# Patient Record
Sex: Male | Born: 1975 | Race: White | Hispanic: No | Marital: Married
Health system: Midwestern US, Community
[De-identification: ages and names within clinical notes are randomized; demographics above are authoritative.]

---

## 2016-10-29 ENCOUNTER — Inpatient Hospital Stay
Admit: 2016-10-29 | Discharge: 2016-10-29 | Disposition: A | Discharge status: Home / Self Care | Payer: PRIVATE HEALTH INSURANCE | Attending: Emergency Medicine

## 2016-10-29 DIAGNOSIS — L03011 Cellulitis of right finger: Secondary | ICD-10-CM

## 2016-10-29 MED ORDER — CEPHALEXIN 250 MG PO CAPS
250 MG | Freq: Once | ORAL | Status: AC
Start: 2016-10-29 — End: 2016-10-29
  Administered 2016-10-29: 14:00:00 500 mg via ORAL

## 2016-10-29 MED ORDER — BACITRACIN 500 UNIT/GM EX OINT
500 UNIT/GM | Freq: Once | CUTANEOUS | Status: AC
Start: 2016-10-29 — End: 2016-10-29
  Administered 2016-10-29: 14:00:00 via TOPICAL

## 2016-10-29 MED ORDER — CEPHALEXIN 500 MG PO CAPS
500 MG | ORAL_CAPSULE | Freq: Four times a day (QID) | ORAL | 0 refills | Status: AC
Start: 2016-10-29 — End: 2016-11-05

## 2016-10-29 MED ORDER — SULFAMETHOXAZOLE-TRIMETHOPRIM 800-160 MG PO TABS
800-160 MG | ORAL_TABLET | Freq: Two times a day (BID) | ORAL | 0 refills | Status: AC
Start: 2016-10-29 — End: 2016-11-05

## 2016-10-29 MED ORDER — SULFAMETHOXAZOLE-TRIMETHOPRIM 800-160 MG PO TABS
800-160 MG | Freq: Once | ORAL | Status: AC
Start: 2016-10-29 — End: 2016-10-29
  Administered 2016-10-29: 14:00:00 1 via ORAL

## 2016-10-29 MED FILL — BACITRACIN 500 UNIT/GM EX OINT: 500 UNIT/GM | CUTANEOUS | Qty: 28

## 2016-10-29 MED FILL — CEPHALEXIN 250 MG PO CAPS: 250 MG | ORAL | Qty: 2

## 2016-10-29 MED FILL — SULFAMETHOXAZOLE-TRIMETHOPRIM 800-160 MG PO TABS: 800-160 MG | ORAL | Qty: 1

## 2016-10-29 NOTE — ED Provider Notes (Signed)
[place] [facility]  eMERGENCY dEPARTMENT eNCOUnter    Pt Name: Glen Garcia  MRN: [id number]  [place] 08/21/1975  Date of evaluation: 10/29/16  CHIEF COMPLAINT       Chief Complaint   Patient presents with   . Paronychia     right ring      HISTORY OF PRESENT ILLNESS     Hand Problem   Location:  Finger  Finger location:  R ring finger  Injury: no    Pain details:     Quality:  Aching    Radiates to:  Does not radiate    Severity:  Moderate    Onset quality:  Gradual    Duration:  2 days    Timing:  Constant    Progression:  Worsening  Handedness:  Right-handed  Dislocation: no    Foreign body present:  No foreign bodies  Prior injury to area:  No  Worsened by:  Nothing  Ineffective treatments: warm soaks.  Associated symptoms: swelling    Associated symptoms: no fatigue and no fever        REVIEW OF SYSTEMS     Review of Systems   Constitutional: Negative for fatigue and fever.   All other systems reviewed and are negative.    PAST MEDICAL HISTORY     Past Medical History:   Diagnosis Date   . Multiple sclerosis ([place])      SURGICAL HISTORY     History reviewed. No pertinent surgical history.  CURRENT MEDICATIONS       Discharge Medication List as of 10/29/2016  9:41 AM      CONTINUE these medications which have NOT CHANGED    Details   esomeprazole Magnesium (NEXIUM) 20 MG PACK Take 20 mg by mouth dailyHistorical Med           ALLERGIES     has No Known Allergies.  FAMILY HISTORY     has no family status information on file.      SOCIAL HISTORY      reports that he has been smoking Cigarettes.  He has never used smokeless tobacco.  PHYSICAL EXAM     INITIAL VITALS: BP (!) 138/99   Pulse 71   Temp 98.1 F (36.7 C) (Oral)   Resp 20   Ht 6\' 1"  (1.854 m)   Wt 225 lb (102.1 kg)   SpO2 96%   BMI 29.69 kg/m    Physical Exam   Constitutional: He is oriented to person, place, and time. He appears well-developed and well-nourished. No distress.   HENT:   Head: Normocephalic and atraumatic.   Right Ear: External ear  normal.   Left Ear: External ear normal.   Nose: Nose normal.   Mouth/Throat: Oropharynx is clear and moist.   Eyes: Conjunctivae and EOM are normal. Pupils are equal, round, and reactive to light. Right eye exhibits no discharge. Left eye exhibits no discharge.   Neck: Normal range of motion. Neck supple. No tracheal deviation present.   Cardiovascular: Normal rate, regular rhythm, normal heart sounds and intact distal pulses.    Pulmonary/Chest: Effort normal and breath sounds normal. No stridor. No respiratory distress. He has no wheezes. He has no rales. He exhibits no tenderness.   Abdominal: Soft. Bowel sounds are normal. There is no tenderness. There is no rebound and no guarding.   Musculoskeletal: Normal range of motion. He exhibits no edema or tenderness.   Right ring finger edema or tenderness fluctuance right ring finger  along the radial aspect nailfold   Neurological: He is alert and oriented to person, place, and time. No cranial nerve deficit. Coordination normal.   Skin: Skin is warm and dry. No rash noted. He is not diaphoretic. No erythema.   Psychiatric: He has a normal mood and affect. His behavior is normal. Judgment normal.       MEDICAL DECISION MAKING:   MDM  Paronychia, please see procedure note below, print Keflex and Bactrim for this.  Discussed with the patient anticipatory guidance, discharge actions, follow-up PCP 24 hours which he agrees to do.    PROCEDURES:  Paronychia right ring finger abscess soaked with saline for [place] 30 minutes then ChloraPrep use for sterile technique, then 18-gauge needle used to lift the cuticle skin at nailfold and the abscess was drained using this small incision, foul-smelling pus was drained easily, patient tolerated procedure well, procedure was performed by myself.  Nailbed dressing with bacitracin ordered for nursing to place.  Procedures    DIAGNOSTIC RESULTS     RADIOLOGY:All plain film, CT, MRI, and formal ultrasound images (except ED bedside  ultrasound) are read by the radiologist, see reports below, unless otherwise noted in MDM or here.  No orders to display     LABS: All lab results were reviewed by myself, and all abnormals are listed below.  Labs Reviewed - No data to display  EMERGENCY DEPARTMENT COURSE:   Vitals:    Vitals:    10/29/16 0824   BP: (!) 138/99   Pulse: 71   Resp: 20   Temp: 98.1 F (36.7 C)   TempSrc: Oral   SpO2: 96%   Weight: 225 lb (102.1 kg)   Height: 6\' 1"  (1.854 m)       The patient was given the following medications while in the emergency department:  Orders Placed This Encounter   Medications   . cephALEXin (KEFLEX) capsule 500 mg   . sulfamethoxazole-trimethoprim (BACTRIM DS;SEPTRA DS) 800-160 MG per tablet 1 tablet   . bacitracin ointment   . cephALEXin (KEFLEX) 500 MG capsule     Sig: Take 1 capsule by mouth 4 times daily for 7 days     Dispense:  28 capsule     Refill:  0   . sulfamethoxazole-trimethoprim (BACTRIM DS) 800-160 MG per tablet     Sig: Take 1 tablet by mouth 2 times daily for 7 days     Dispense:  14 tablet     Refill:  0       FINAL IMPRESSION      1. Paronychia of finger of right hand          DISPOSITION/PLAN   DISPOSITION Decision To Discharge 10/29/2016 09:40:02 AM      PATIENT REFERRED TO:  Alex Gardener, [place] [address]  [phone]    Schedule an appointment as soon as possible for a visit in 1 day      DISCHARGE MEDICATIONS:  Discharge Medication List as of 10/29/2016  9:41 AM      START taking these medications    Details   cephALEXin (KEFLEX) 500 MG capsule Take 1 capsule by mouth 4 times daily for 7 days, Disp-28 capsule, R-0Print      sulfamethoxazole-trimethoprim (BACTRIM DS) 800-160 MG per tablet Take 1 tablet by mouth 2 times daily for 7 days, Disp-14 tablet, R-0Print           Franchot Mimes, MD  Attending Emergency  Physician  Dragon voice recognition software used in portions of this document.                    Franchot Mimes, MD  10/29/16 [phone]

## 2018-12-22 ENCOUNTER — Ambulatory Visit: Admit: 2018-12-22 | Discharge: 2018-12-22 | Payer: PRIVATE HEALTH INSURANCE | Attending: Dermatology

## 2018-12-22 DIAGNOSIS — D294 Benign neoplasm of scrotum: Secondary | ICD-10-CM

## 2018-12-22 NOTE — Progress Notes (Signed)
Dermatology Patient Note  [facility]  [address]  [place] [address]  Dept: [phone]  Dept Fax: [phone]      VISITDATE: 12/22/2018   REFERRING PROVIDER: No ref. provider found      Glen Garcia is a 43 y.o. male  who presents today in the office for:    New Patient (since january he had a lesion on his face that was painful in april. his family doc referred him to another dr and he had several lesions cut out. one was a bcc and scc. he had the excision as well. 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    Patient reports concerning lesion:    Location: left chest  Duration: months  Symptoms: scaly  Course: prsistent  Prior biopsy: none  Prior treatment: none    Dermatologic history:  12/06/18 BCC right shoulder s/p excision (Dr. 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 Elisabeth Cara plastic surgery)    CURRENT MEDICATIONS:   Current Outpatient Medications   Medication Sig Dispense Refill   ??? GILENYA 0.5 MG CAPS      ??? simvastatin (ZOCOR) 10 MG tablet take 1 tablet by mouth at bedtime     ??? Cholecalciferol (VITAMIN D-3) 5000 UNIT/ML LIQD 1 capsule     ??? esomeprazole Magnesium (NEXIUM) 20 MG PACK Take 20 mg by mouth daily       No current facility-administered medications for this visit.        ALLERGIES:   No Known Allergies    SOCIAL HISTORY:  Social History     Tobacco Use   ??? Smoking status: Current Every Day Smoker     Types: Cigarettes   ??? Smokeless tobacco: Never Used   Substance Use Topics   ??? Alcohol use: Yes     Comment: occ       REVIEW OF SYSTEMS:  Review of Systems  Skin: Denies any new changing, growing orbleeding lesions or rashes except as described in the HPI   Constitutional: Denies fevers, chills, and malaise.    PHYSICAL EXAM:   BP 119/80 (Site: Right Upper Arm, Position: Sitting, Cuff Size: Large Adult)    Pulse  71    Temp 97.9 ??F (36.6 ??C)    Ht 6\' 1"  (1.854 m)    Wt 229 lb 9.6 oz (104.1 kg)    SpO2 93%    BMI 30.29 kg/m??     General Exam:  General Appearance: No acute distress, Well nourished     Neuro: Alert and oriented to person, place and time  Psych: Normal affect   Lymph Node: Not performed    Cutaneous Exam: Performed as documented in clinic note below.  Total body skin exam, including head/face, neck, both arms, chest, back, abdomen, both legs, buttocks, digits and/or nails, was examined. Genital exam was deferred as patient denied having any lesions in this area.    Pertinent Physical Exam Findings:  Physical Exam  Gritty erythematous macule(s) on left chest  Actinic damage of the face, neck, trunk and upper and lower extremities  Well healed scars of back, thigh, cheek  Dark red small papules of scrotum  Folliculitis of chest and back    Photo surveillance performed: No    Medical Necessity of Exam Performed:   Distribution  of patient concerns    Additional Diagnostic Testing performed during exam: Not performed ,  Not performed    ASSESSMENT:   Diagnosis Orders   1. Angiokeratoma of scrotum     2. History of nonmelanoma skin cancer     3. Actinic skin damage         Plan of Action is as Follows:  Assessment   1. Angiokeratoma of scrotum  reassurance and education    2. History of nonmelanoma skin cancer  - NER    3. Actinic skin damage  Counseled on sun protection: avoidance, seek shade; use clothing/hats/scarves; use generous quantity of sunscreen, re-apply every 2 hours.    4. Actinic keratosis  Cryotherapy: After verbal consent was obtained including discussion of the risks (lesion persistence, lesion recurrence and hypo/hyperpigmentation) and benefits (resolution of the lesion) 2 total Actinic Keratosis on the left chest were treated once with liquid nitrogen to achieve a 2-3 mm freeze border.    5. Folliculitis  - Patient instructed to start benzoyl peroxide wash to acne prone areas each morning. Examples  of OTC products were given in AVS.    RTC 6 months            Patient Instructions   - Benzoyl peroxide wash daily in the shower to alleviate folliculitis  - Follow up in 6 months    Sun Protection     There are two types of sun rays that are harmful to the skin.  UVA rays cause skin aging and skin cancer, such as melanoma.  UVB rays cause sunburns, cataracts, and also contribute to skin cancer.    The American-Academy of Dermatology recommends that children and adults wear a broad spectrum, waterproof sunscreen with a Sun Protection Factor (SPF) of 30 or higher.  It is important to check the ingredient label to be sure the sunscreen will protect the skin from both UVA and UVB sunrays.  Your sunscreen should contain at least one of the following ingredients: titanium dioxide, zinc oxide, or avobenzone.    Sunscreen will not be effective unless it is applied to all exposed skin.  Sunscreens work best if they are applied 30 minutes before sun exposure.  They should be reapplied every 2 hours and after any water exposure.    Sunscreen is not perfect.  It is important to use other methods to protect the skin from sun exposure also.  Wear hats, sunglasses and other sun protective clothing when outdoors.  Stay in the shade during the peak hours of sun exposure between 10 AM and 4 PM.      *Sebaceous hyperplasia is a common, benign condition of sebaceous glands. It represents benign enlargement of the sebaceous lobule and around a follicular infundibulum. Lesions are benign with no known potential for malignant transformation and do not require treatment.    *Angiokeratoma is a condition in which small, dark spots appear on the skin. They can appear anywhere on your body. These lesions happen when tiny blood vessels called capillaries dilate, or widen, near the surface of your skin. Angiokeratomas may feel rough to the touch      Cryotherapy    Liquid Nitrogen - "freeze" (Cryotherapy)  Your doctor has treated your skin  lesions with a very cold substance.  The liquid nitrogen is so cold that it may feel like the skin is burning during application.  A clear blister or blood blister may form after treatment and may later form a scab.  Leave the  area alone.  Usually this scab will fall of within 1-2 weeks.  The area should be kept clean and can be covered with Vaseline and a Band-Aid if needed. If a large blister develops it is ok to use a clean needle to gently pop the blister. Please call our office with any concerns at [phone].    Examples of BPO wash include: Panoxyl Wash, Acne Free brand oil-free acne cleanser, Neutrogena Clear Pore Cleanser/Mask, Clean and Clear advantage 3 in 1 exfoliating cleanser, Clean and Clear Continuous Control Acne Cleanser, Oxy maximum face wash).            Follow-up: No follow-ups on file.      This note was created with the assistance of a speech-recognition program.  Although the intention is to generate a document that actually reflects the content of the visit, no guarantees can be provided that every mistake has been identified and corrected byediting.    Electronically signed by Bethann Humble, MD on 12/22/18 at 1:30 PM EDT

## 2018-12-22 NOTE — Patient Instructions (Signed)
-   Benzoyl peroxide wash daily in the shower to alleviate folliculitis  - Follow up in 6 months    Sun Protection     There are two types of sun rays that are harmful to the skin.  UVA rays cause skin aging and skin cancer, such as melanoma.  UVB rays cause sunburns, cataracts, and also contribute to skin cancer.    The American-Academy of Dermatology recommends that children and adults wear a broad spectrum, waterproof sunscreen with a Sun Protection Factor (SPF) of 30 or higher.  It is important to check the ingredient label to be sure the sunscreen will protect the skin from both UVA and UVB sunrays.  Your sunscreen should contain at least one of the following ingredients: titanium dioxide, zinc oxide, or avobenzone.    Sunscreen will not be effective unless it is applied to all exposed skin.  Sunscreens work best if they are applied 30 minutes before sun exposure.  They should be reapplied every 2 hours and after any water exposure.    Sunscreen is not perfect.  It is important to use other methods to protect the skin from sun exposure also.  Wear hats, sunglasses and other sun protective clothing when outdoors.  Stay in the shade during the peak hours of sun exposure between 10 AM and 4 PM.      *Sebaceous hyperplasia is a common, benign condition of sebaceous glands. It represents benign enlargement of the sebaceous lobule and around a follicular infundibulum. Lesions are benign with no known potential for malignant transformation and do not require treatment.    *Angiokeratoma is a condition in which small, dark spots appear on the skin. They can appear anywhere on your body. These lesions happen when tiny blood vessels called capillaries dilate, or widen, near the surface of your skin. Angiokeratomas may feel rough to the touch      Cryotherapy    Liquid Nitrogen - "freeze" (Cryotherapy)  Your doctor has treated your skin lesions with a very cold substance.  The liquid nitrogen is so cold that it may feel  like the skin is burning during application.  A clear blister or blood blister may form after treatment and may later form a scab.  Leave the area alone.  Usually this scab will fall of within 1-2 weeks.  The area should be kept clean and can be covered with Vaseline and a Band-Aid if needed. If a large blister develops it is ok to use a clean needle to gently pop the blister. Please call our office with any concerns at [phone].    Examples of BPO wash include: Panoxyl Wash, Acne Free brand oil-free acne cleanser, Neutrogena Clear Pore Cleanser/Mask, Clean and Clear advantage 3 in 1 exfoliating cleanser, Clean and Clear Continuous Control Acne Cleanser, Oxy maximum face wash).

## 2019-07-03 ENCOUNTER — Encounter: Attending: Dermatology

## 2020-02-15 ENCOUNTER — Encounter: Attending: Dermatology

## 2022-10-20 IMAGING — DX CHEST PA AND LATERAL
1 series · 2 of 2 positions shown · non-contrast
Comparison: None.

________________________________________________________________________________________________ 
CLINICAL INDICATION: Chronic cough.

[Series 1: PA · 0.14mm/px · 2 of 2 slices shown]
[im 1/2]
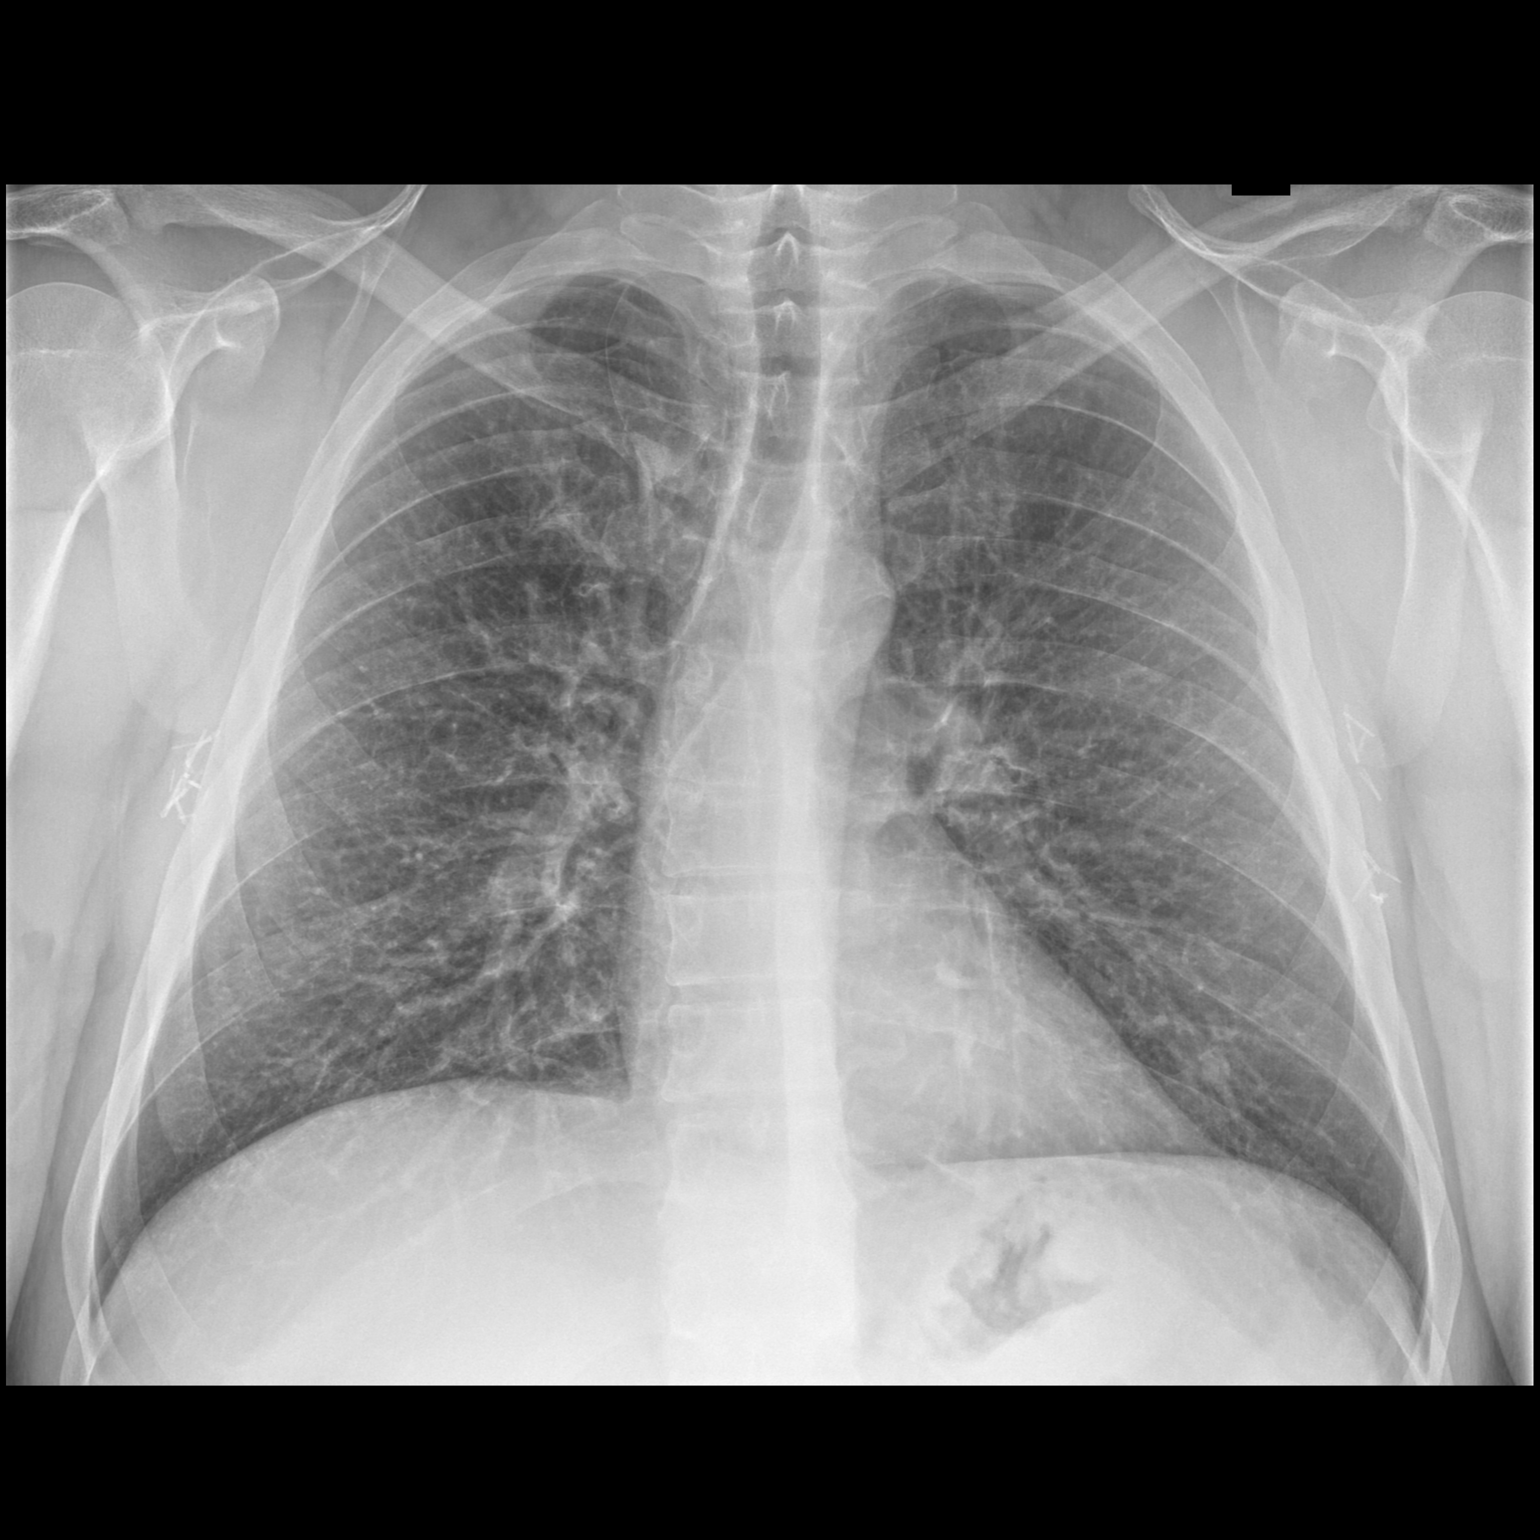
[im 2/2]
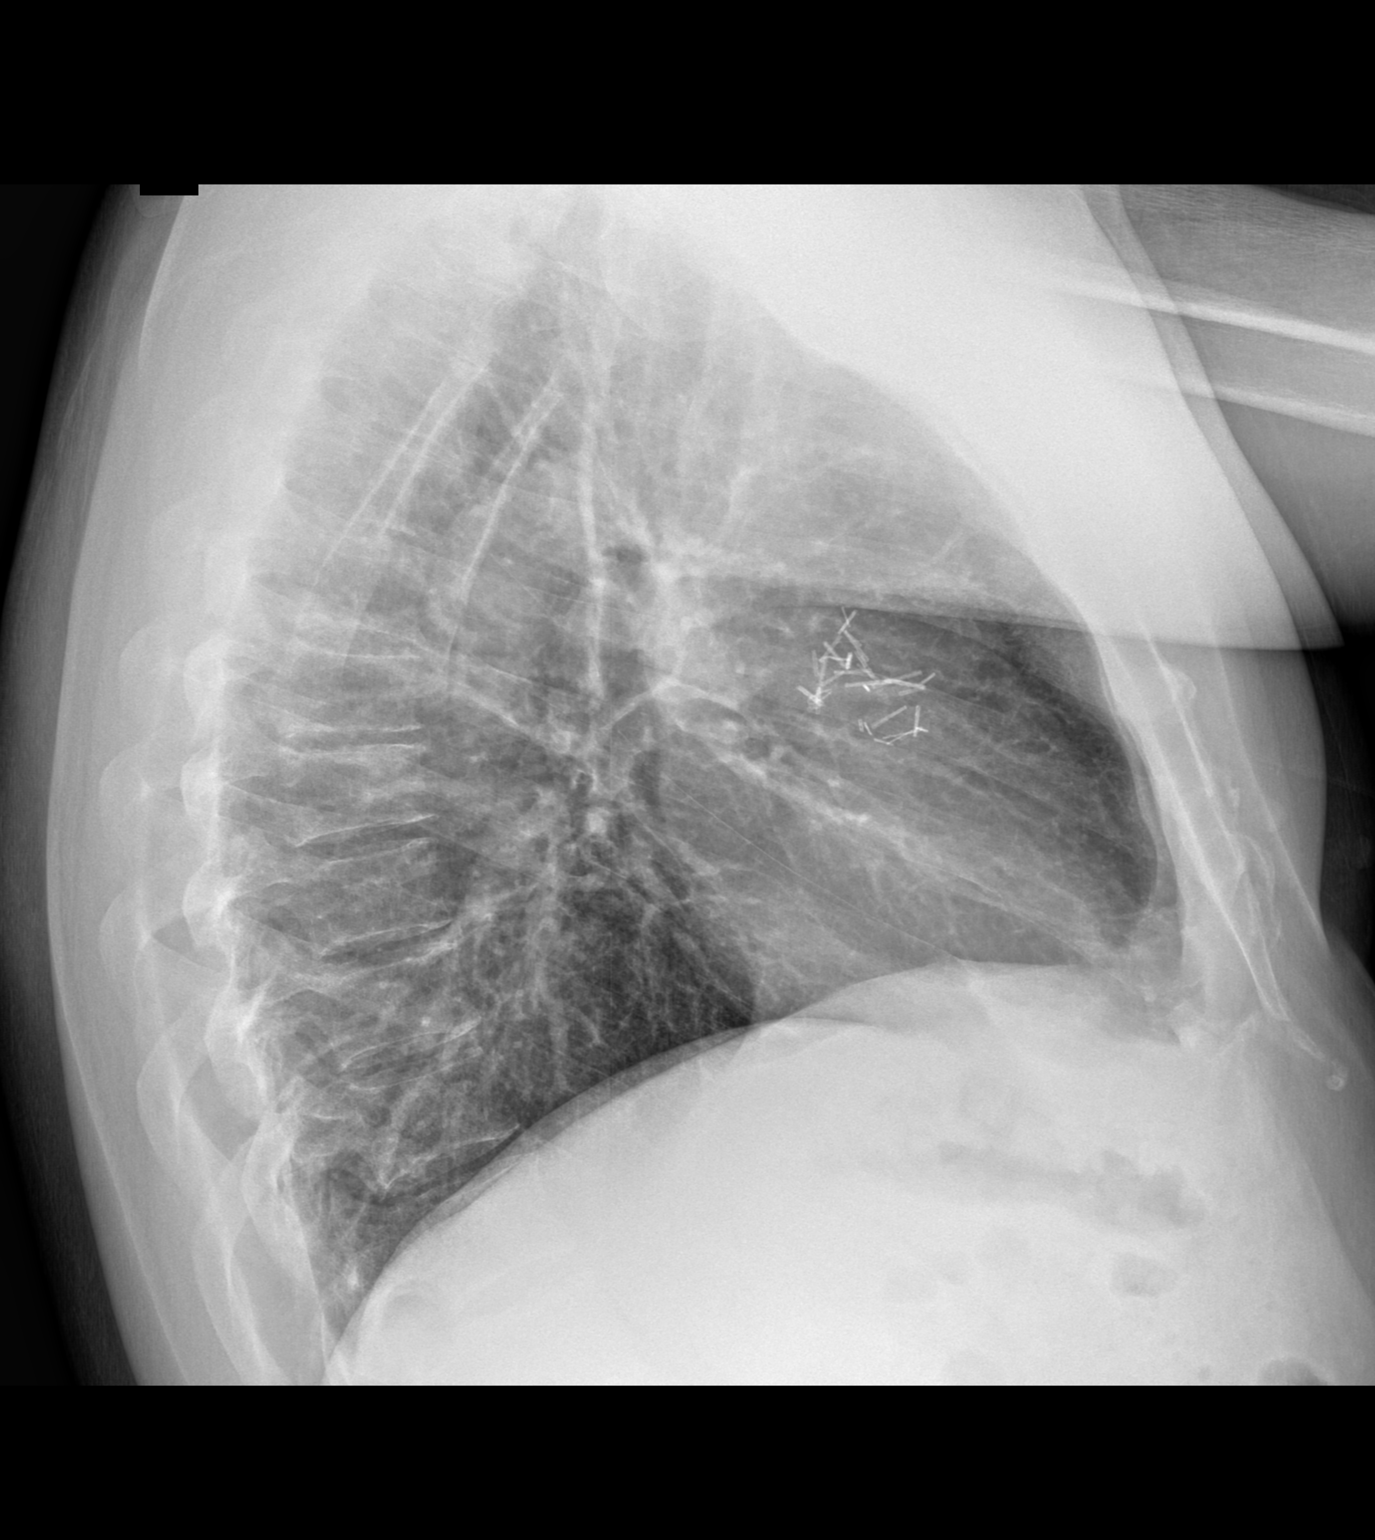

[2 of 2 positions shown; findings below may reference images not displayed]

FINDINGS: Postoperative changes are seen overlying the axilla bilaterally. No 
discrete mass or consolidation. No pleural effusion seen.
IMPRESSION: No acute abnormality seen. If patients symptoms should persist CT may better 
define.

## 2022-11-09 IMAGING — CT CT CHEST WITH CONTRAST
2 of 4 series · 15 of 36 positions shown, 18 images · IV contrast (APPLIED)
Comparison: None.

________________________________________________________________________________________________ 
CT CHEST WITH CONTRAST, 11/09/2022 [DATE]: 
CLINICAL INDICATION: Chronic cough. 
A search for DICOM formatted images was conducted for prior CT imaging studies 
completed at a non-affiliated media free facility.
TECHNIQUE: The chest was scanned from base of neck through the lung bases with 
75 mL of Isovue 300 MDV injected intravenously on a high resolution low dose CT 
scanner. Routine MPR and MIP reconstruction images were performed.

[Series 4: chest with 2.0 i31s 3 · axial · 0.85mm/px · z∈[-345,-65]mm · 12 of 156 slices shown, 15 images]
[im 8/156  mediastinal]
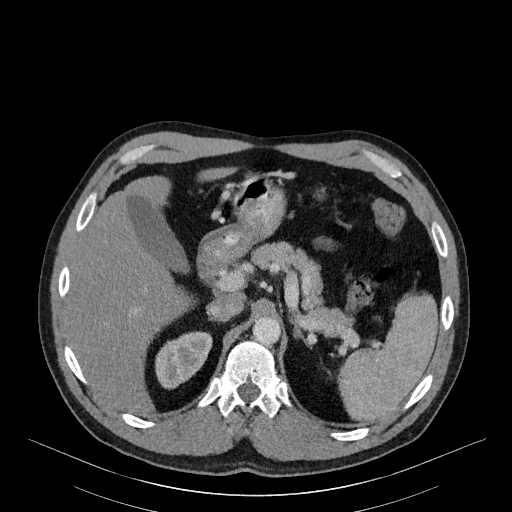
[im 8/156  lung]
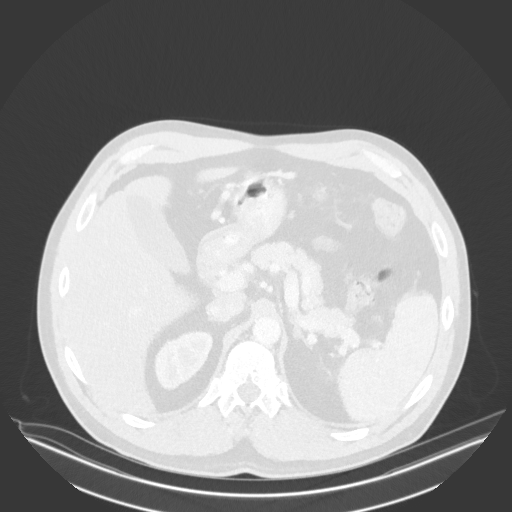
[im 23/156  lung]
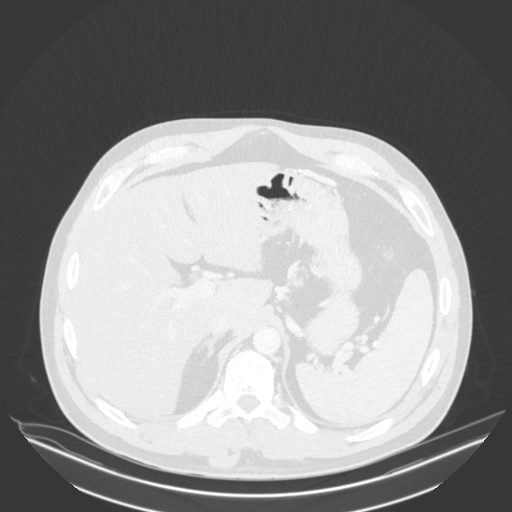
[im 37/156  lung]
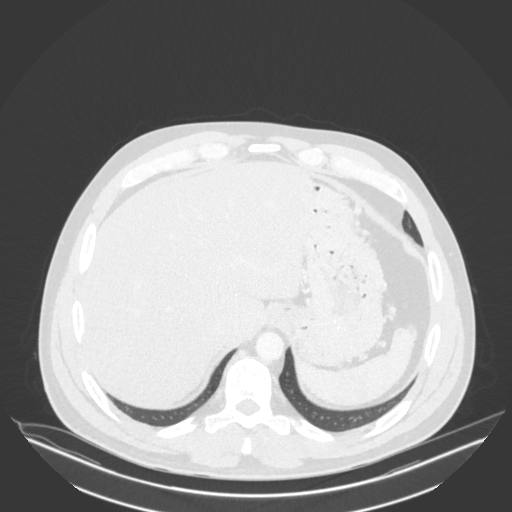
[im 45/156  lung]
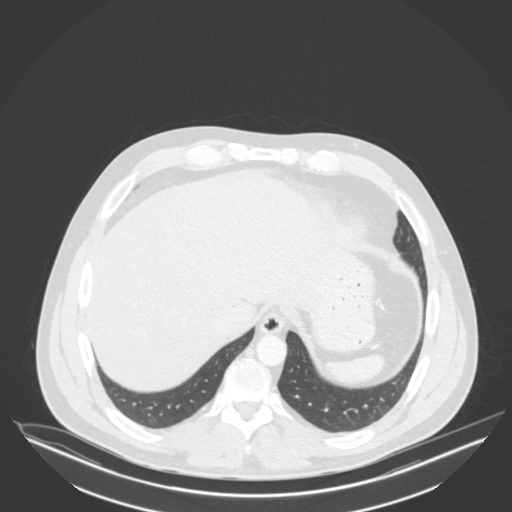
[im 60/156  mediastinal]
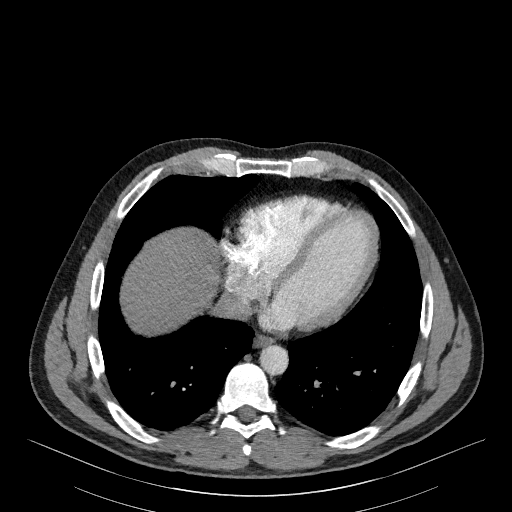
[im 60/156  lung]
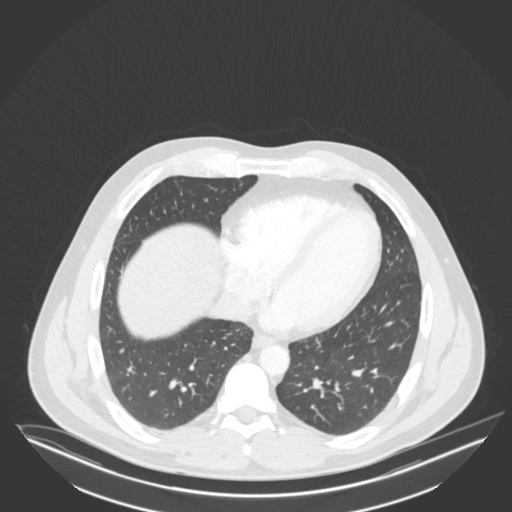
[im 74/156  lung]
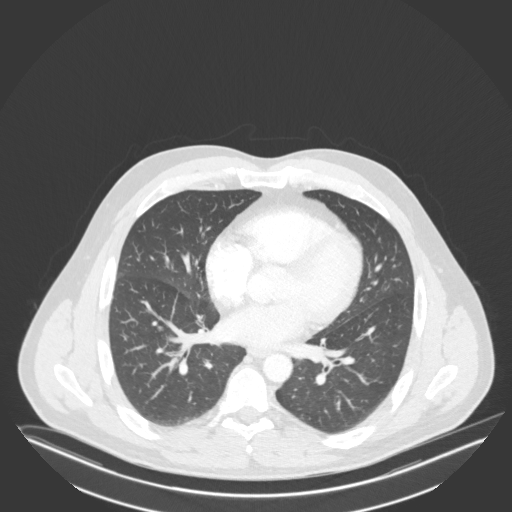
[im 82/156  lung]
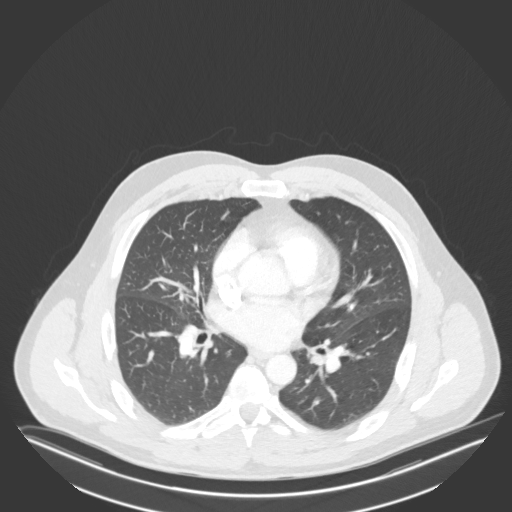
[im 96/156  lung]
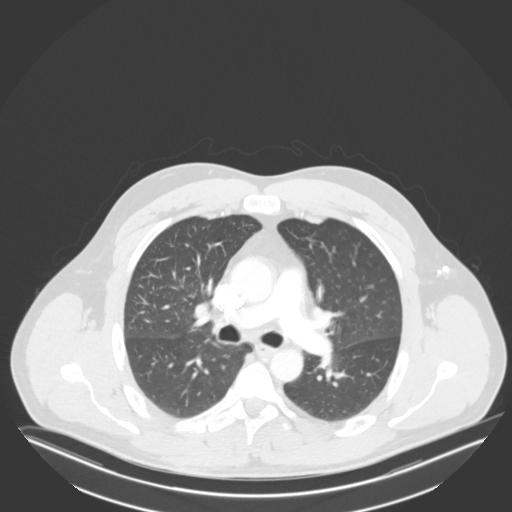
[im 111/156  mediastinal]
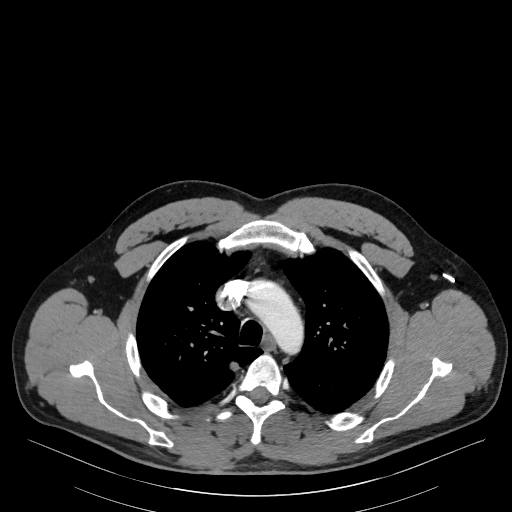
[im 111/156  lung]
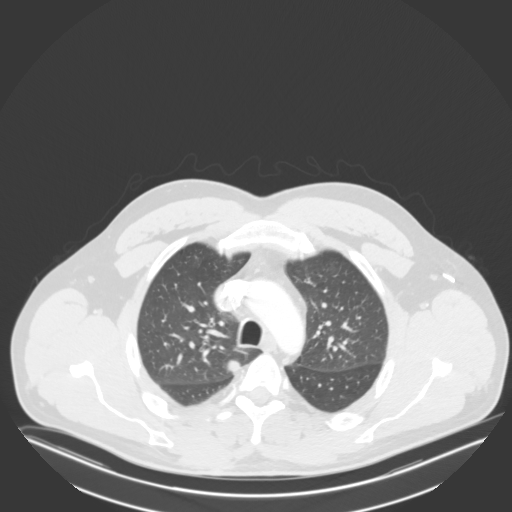
[im 119/156  lung]
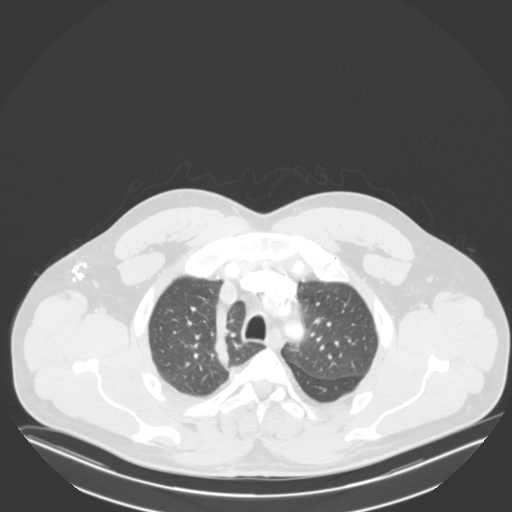
[im 133/156  lung]
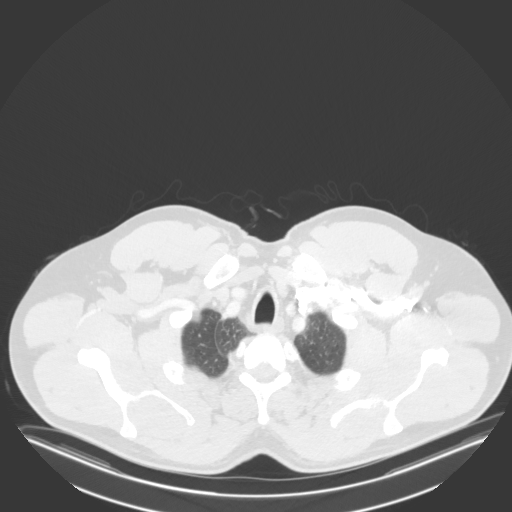
[im 148/156  lung]
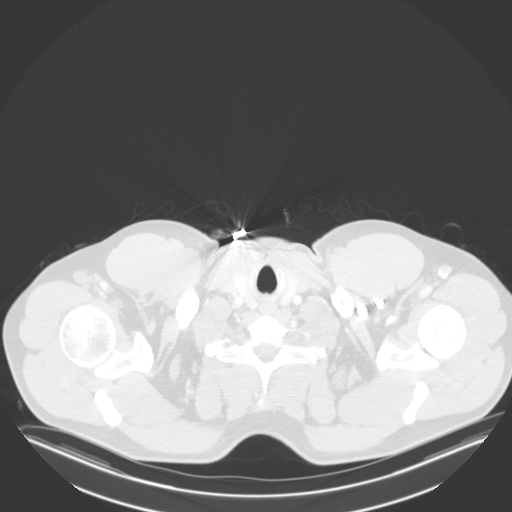

[Series 6: coronal · coronal · 0.65mm/px · 3 of 149 slices shown]
[im 30/149  lung]
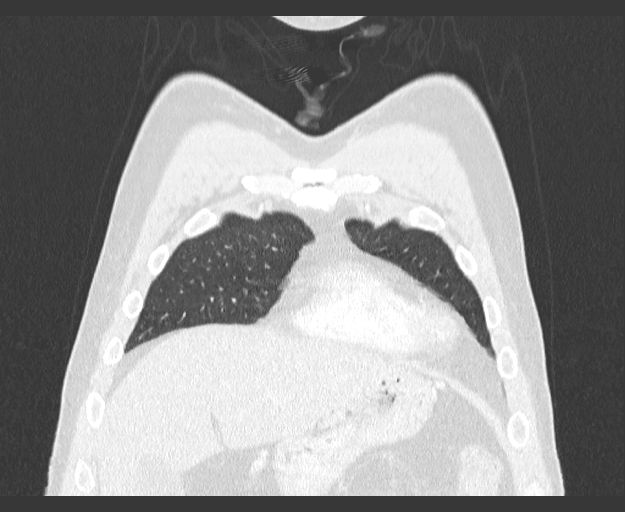
[im 60/149  lung]
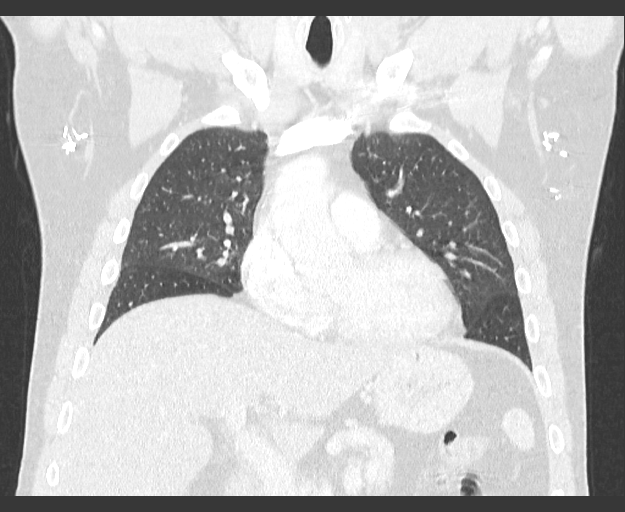
[im 89/149  lung]
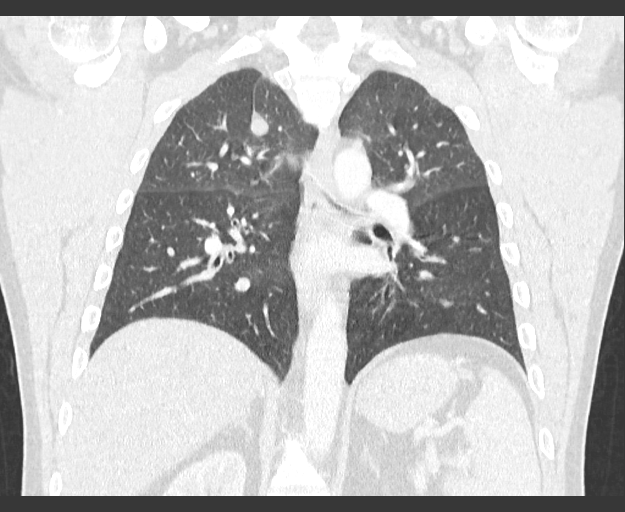

[15 of 36 positions shown; findings below may reference images not displayed]

Count of known CT and Cardiac Nuclear Medicine studies performed in the previous 
12 months = 0.
FINDINGS: LUNGS AND PLEURA:  Peribronchial thickening with scattered atelectasis but no 
acute consolidation. No suspicious pulmonary nodules. No pleural effusion.  
MEDIASTINUM:  No adenopathy. Normal heart size. No pericardial effusion. No 
coronary artery calcifications noted. 
CHEST WALL/AXILLA: No mass or adenopathy.  
UPPER ABDOMEN: Small hiatal hernia. Fatty liver.. 
MUSCULOSKELETAL: No acute abnormality.
IMPRESSION: Mild peribronchial thickening and scattered atelectasis. 
No acute consolidation or effusion. 
No suspicious pulmonary nodules. 
No mediastinal or hilar adenopathy. 
In patients between the ages of 50-77 where pulmonary emphysema is noted on CT, 
recommend evaluation for low dose lung cancer screening protocol if patient is 
not already enrolled; as pulmonary emphysema is an independent risk factor for 
lung cancer. 
RADIATION DOSE REDUCTION: All CT scans are performed using radiation dose 
reduction techniques, when applicable.  Technical factors are evaluated and 
adjusted to ensure appropriate moderation of exposure.  Automated dose 
management technology is applied to adjust the radiation doses to minimize 
exposure while achieving diagnostic quality images.

## 2023-06-24 IMAGING — MR MRI THORACIC SPINE WITHOUT CONTRAST
5 of 8 series · 17 of 48 positions shown · IV contrast (gadolinium)
Comparison: CT chest November 09, 2022.

________________________________________________________________________________________________ 
MRI THORACIC SPINE WITHOUT CONTRAST, 06/24/2023 [DATE]: 
CLINICAL INDICATION: Multiple Sclerosis , annual follow-up. No symptoms.
TECHNIQUE: Multiplanar, multiecho position MR images of the thoracic spine were 
performed without intravenous gadolinium enhancement. Patient was scanned on a 
3T magnet.

[Series 102: T1 · sagittal · 5.5mm · 0.87mm/px · 2 of 15 slices shown]
[im 1/15]
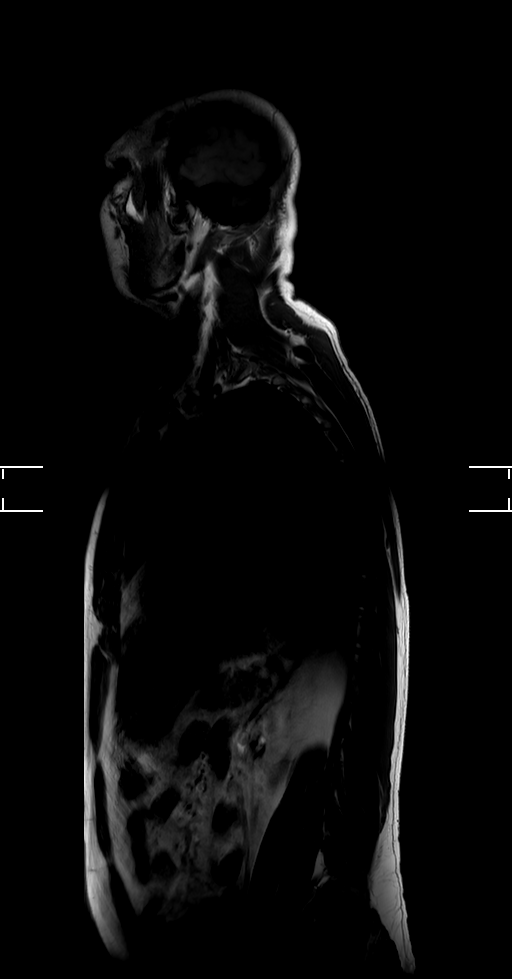
[im 15/15]
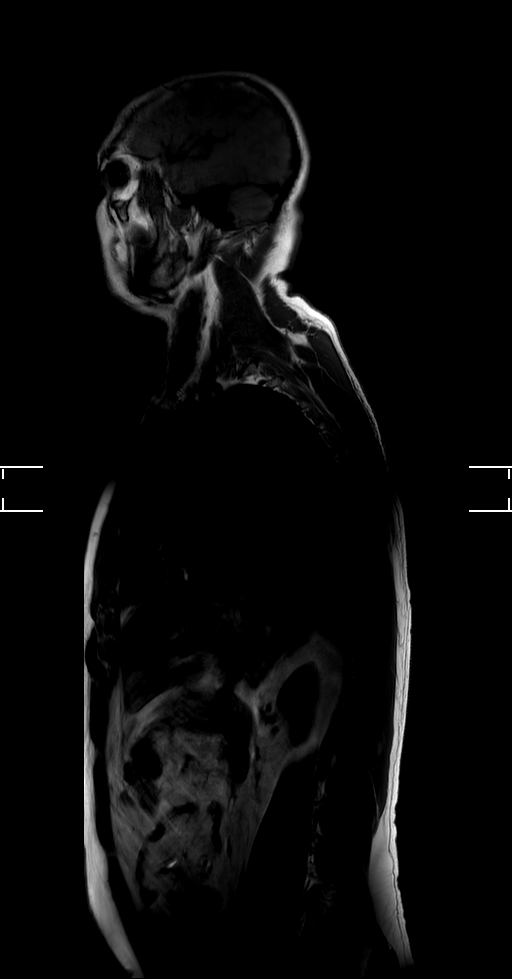

[Series 201: t2w_tse cor · coronal · 6.0mm · 0.51mm/px · 2 of 11 slices shown]
[im 1/11]
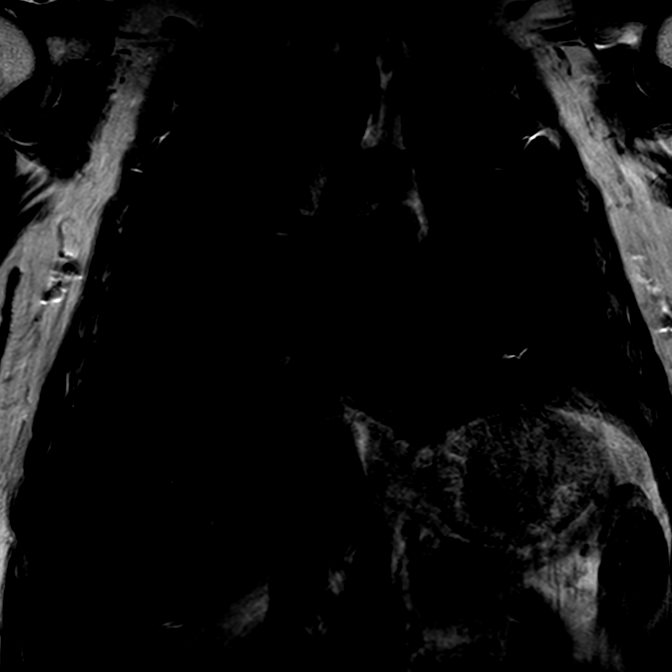
[im 11/11]
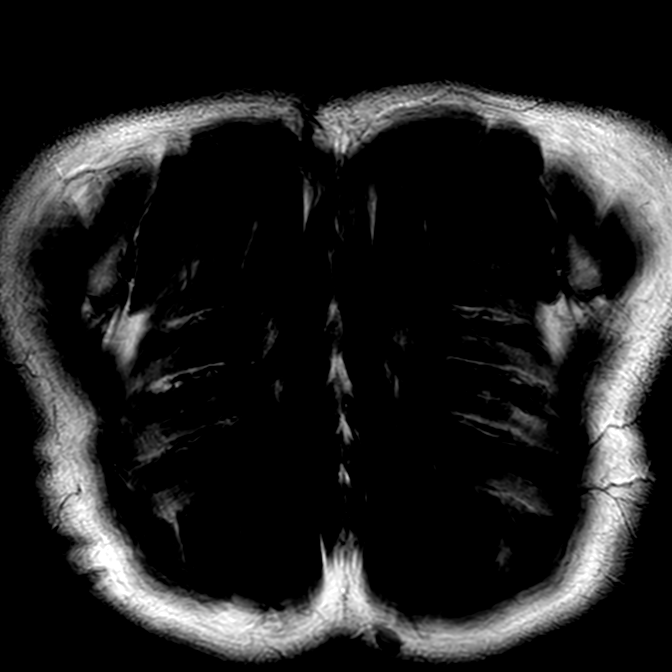

[Series 301: t1w_tse sag · sagittal · 3.0mm · 0.67mm/px · 3 of 21 slices shown]
[im 1/21]
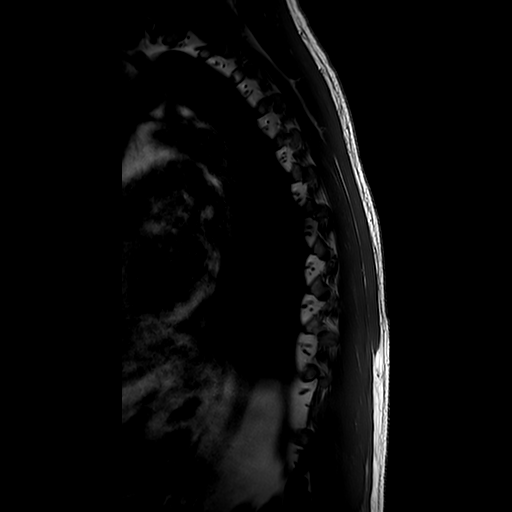
[im 11/21]
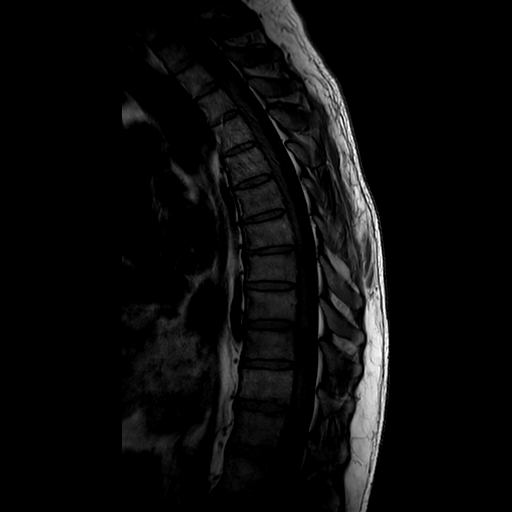
[im 21/21]
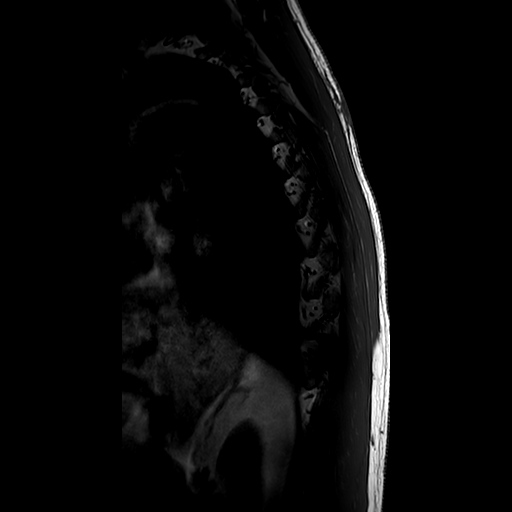

[Series 401: t2w_tse sag · sagittal · 3.0mm · 0.51mm/px · 2 of 21 slices shown]
[im 1/21]
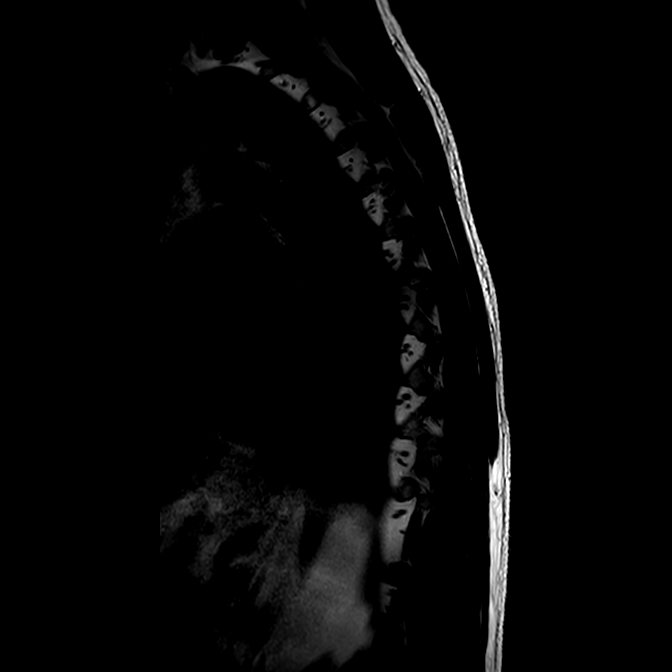
[im 11/21]
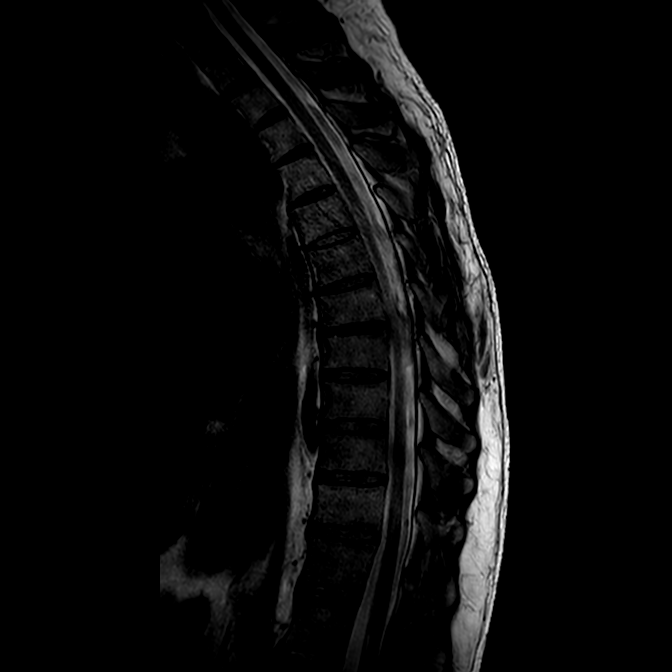

[Series 703: T2 · axial · 4.0mm · 0.42mm/px · z∈[-279,-15]mm · 8 of 70 slices shown]
[im 1/70]
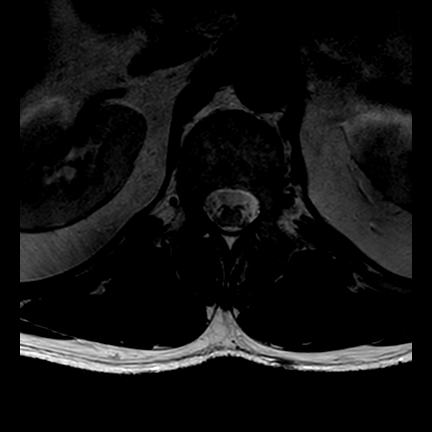
[im 8/70]
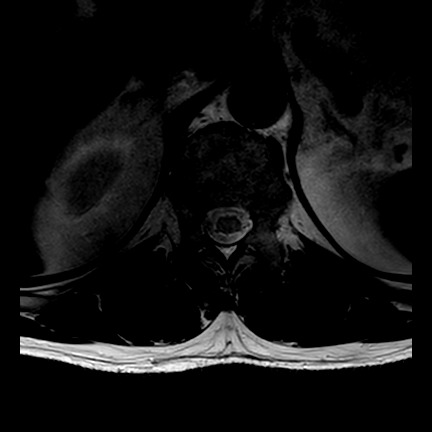
[im 24/70]
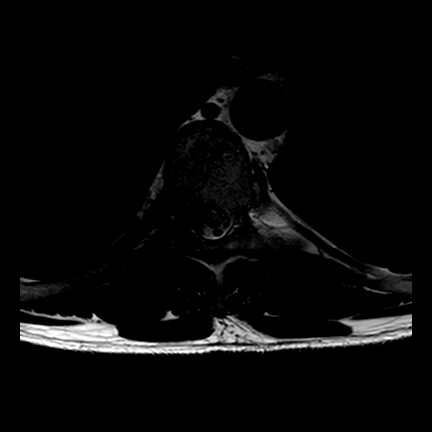
[im 31/70]
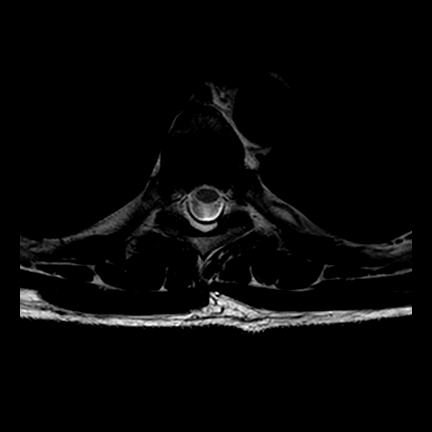
[im 39/70]
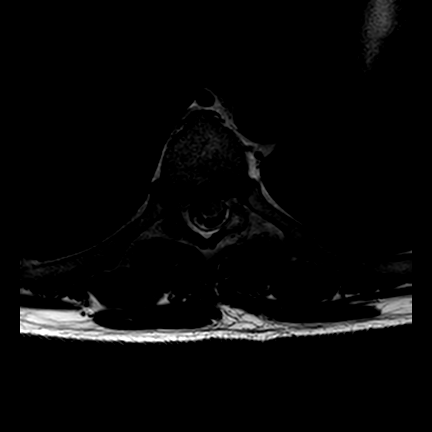
[im 47/70]
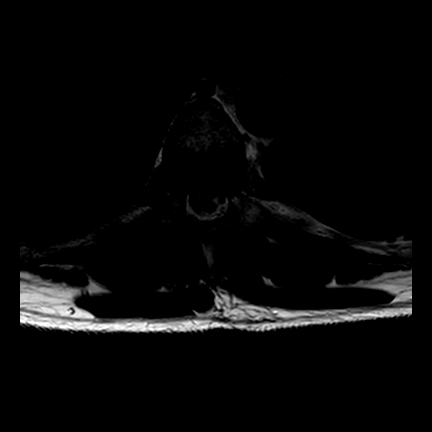
[im 62/70]
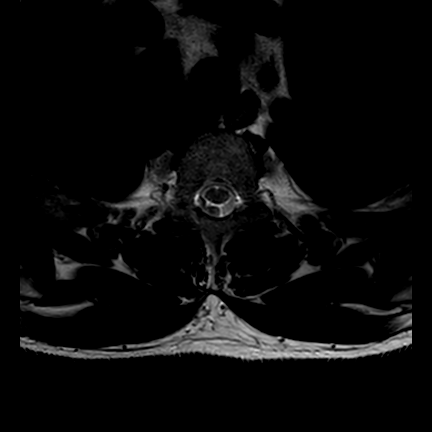
[im 70/70]
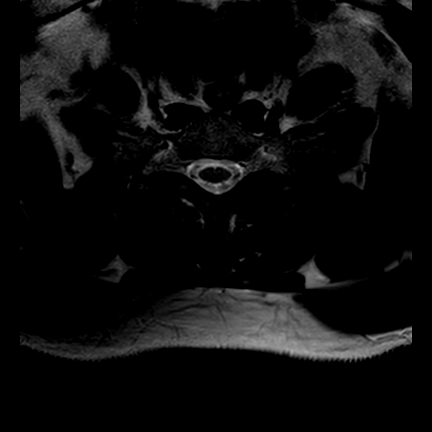

[17 of 48 positions shown; findings below may reference images not displayed]

FINDINGS: Sagittal localizer demonstrates 12 thoracic and 5 lumbar type vertebral bodies. 
-------------------------------------------------------------------------------- 
------ 
GENERAL: 
ALIGNMENT: Mild dextroconvex thoracic scoliosis. Mildly accentuated thoracic 
kyphosis with otherwise anatomic sagittal alignment. 
VERTEBRAL BODY HEIGHT: Normal.  
MARROW SIGNAL: No focal suspect signal abnormality. 
CORD SIGNAL: Normal. 
ADDITIONAL FINDINGS: None. 
-------------------------------------------------------------------------------- 
------ 
RELEVANT SEGMENTAL (levels with severe stenosis or significant findings): 
Multilevel mild degrees of loss of disc height and loss of disc signal. No 
critical or significant canal or foraminal stenosis. 
T7-T8: Loss of disc signal. Central disc protrusion abuts and indents the 
ventral cord margin with mild canal stenosis. Foramina patent. 
Additional scattered discogenic/degenerative changes are noted. 
-------------------------------------------------------------------------------- 
------
IMPRESSION: No evidence of abnormal cord signal. 
Mild thoracic degenerative changes. 
Exam was reviewed and read in conjunction with Dr. Sakamaki.

## 2023-06-25 IMAGING — MR MRI BRAIN WITHOUT CONTRAST
11 of 14 series · 30 of 48 positions shown · IV contrast (gadolinium)
Comparison: Cervical spine June 25, 2023.

________________________________________________________________________________________________ 
MRI BRAIN WITHOUT CONTRAST, 06/25/2023 [DATE]: 
CLINICAL INDICATION: Multiple Sclerosis for 10 years. Annual follow-up. History 
of skin cancer.
TECHNIQUE: Multiplanar, multiecho position MR images of the brain were performed 
without intravenous gadolinium enhancement. Patient was scanned on a 3T magnet.

[Series 101: survey · axial · 10.0mm · 0.98mm/px · z∈[+0,+125]mm · 2 of 5 slices shown]
[im 1/5]
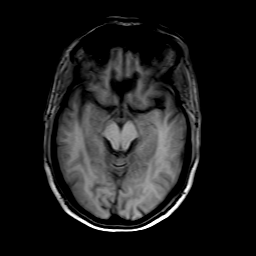
[im 5/5]
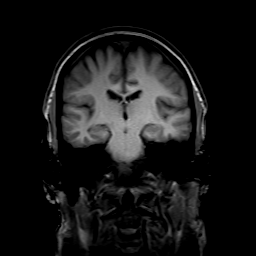

[Series 203: dadc map · axial · 4.0mm · 1.07mm/px · z∈[-76,+84]mm · 2 of 32 slices shown (1 of 2)]
[im 1/32]
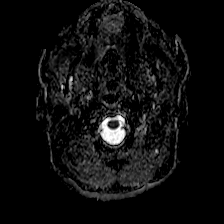
[im 32/32]
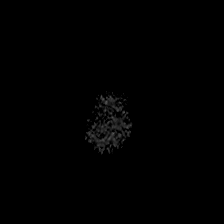

[Series 204: isob (id) · axial · 4.0mm · 1.07mm/px · z∈[-76,+84]mm · 2 of 33 slices shown]
[im 1/33]
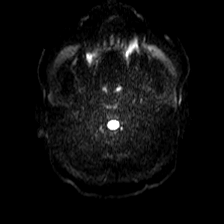
[im 33/33]
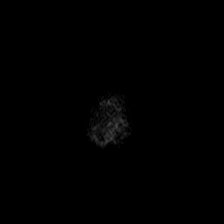

[Series 303: dadc map · coronal · 4.0mm · 0.81mm/px · 2 of 41 slices shown (2 of 2)]
[im 1/41]
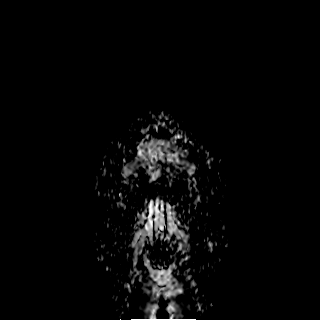
[im 41/41]
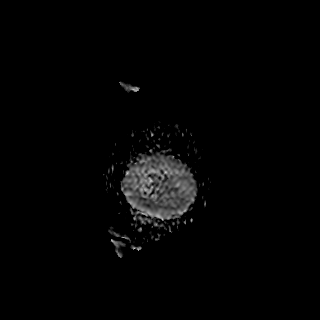

[Series 501: FLAIR fat-sat · axial · 5.0mm · 0.60mm/px · 1 of 28 slices shown]
[im 1/28]
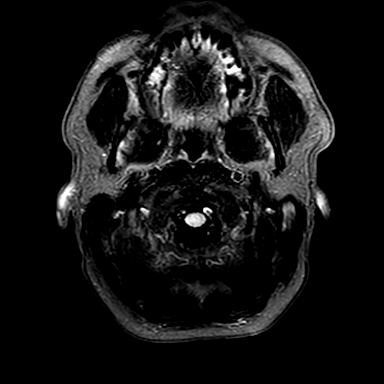

[Series 601: SWI · axial · 3.0mm · 0.53mm/px · z∈[-76,+85]mm · 5 of 109 slices shown (1 of 4)]
[im 1/109]
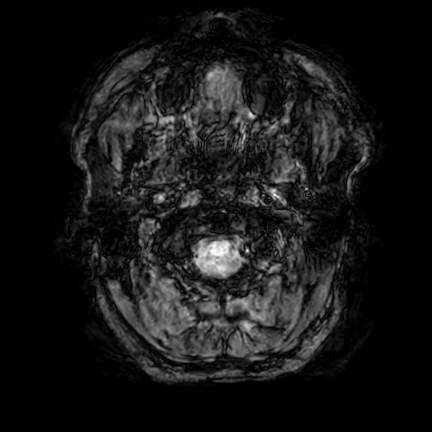
[im 28/109]
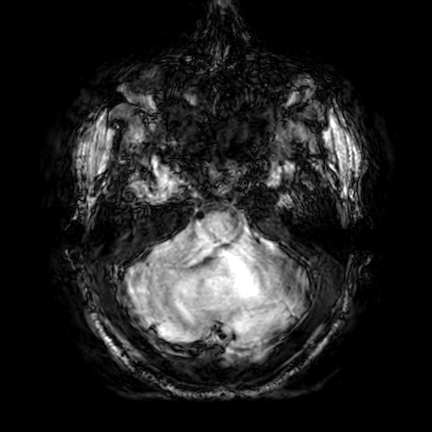
[im 55/109]
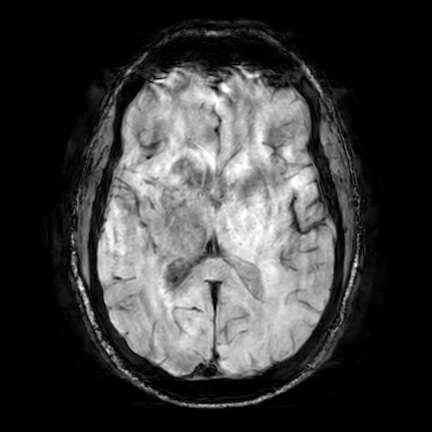
[im 82/109]
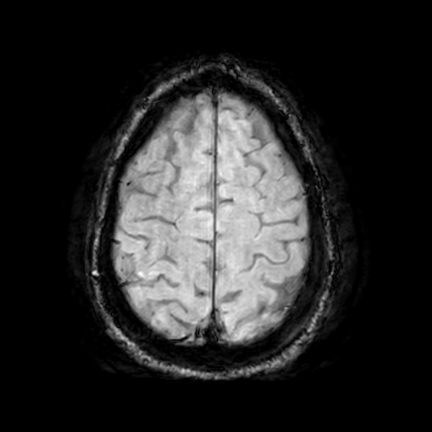
[im 109/109]
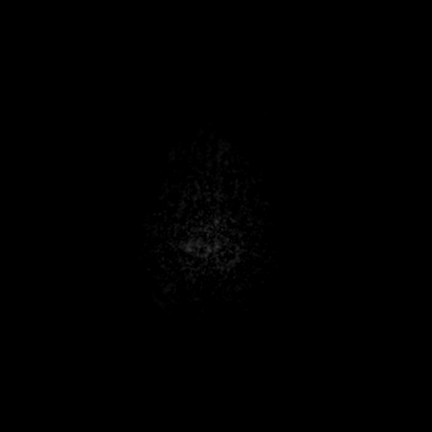

[Series 602: SWI · axial · 10.0mm · 0.53mm/px · z∈[-70,+87]mm · 4 of 80 slices shown (2 of 4)]
[im 1/80]
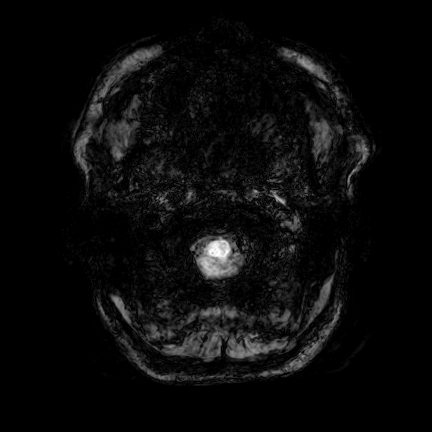
[im 27/80]
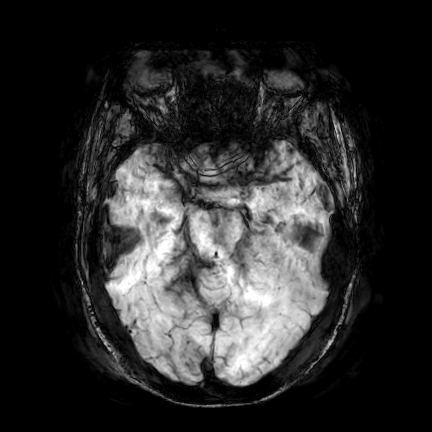
[im 53/80]
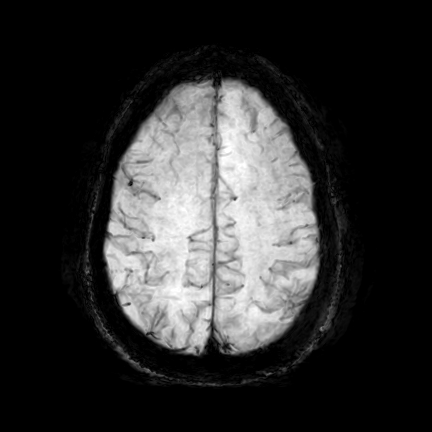
[im 80/80]
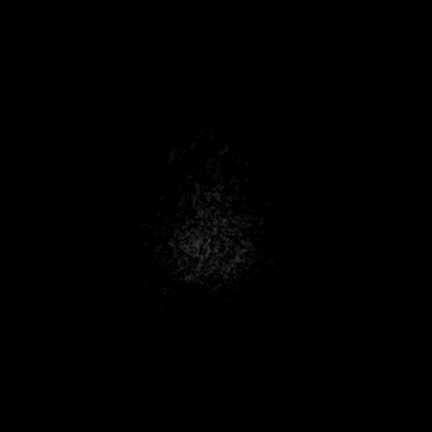

[Series 701: T2 · coronal · 4.0mm · 0.47mm/px · 2 of 41 slices shown]
[im 1/41]
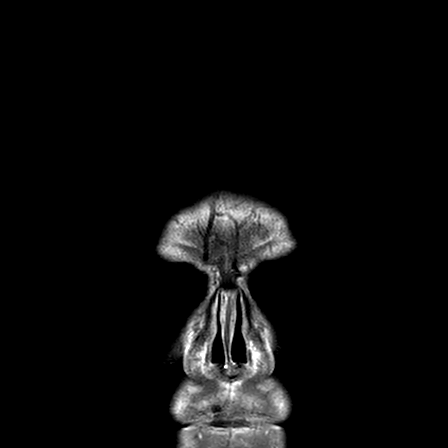
[im 41/41]
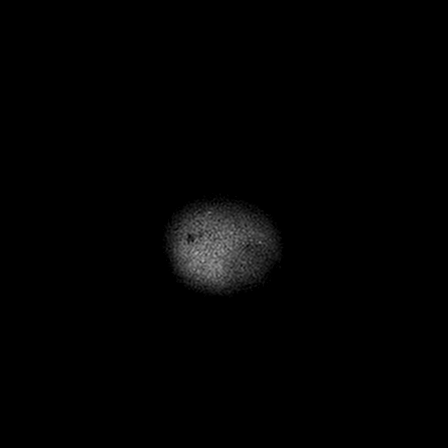

[Series 801: SWI · axial · 3.0mm · 0.53mm/px · z∈[-76,+85]mm · 5 of 109 slices shown (3 of 4)]
[im 1/109]
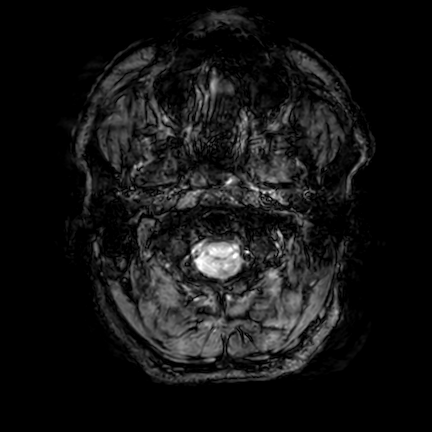
[im 28/109]
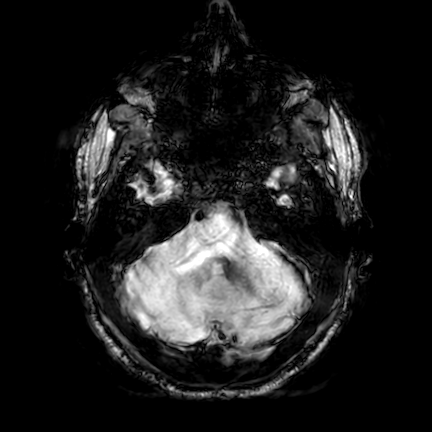
[im 55/109]
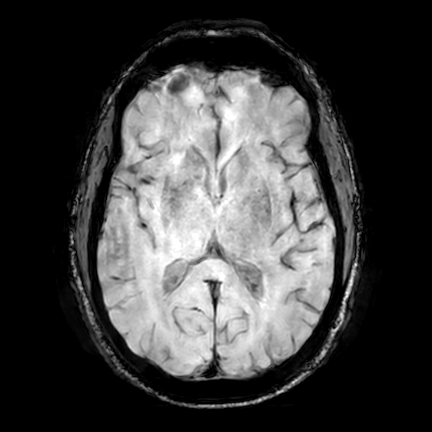
[im 82/109]
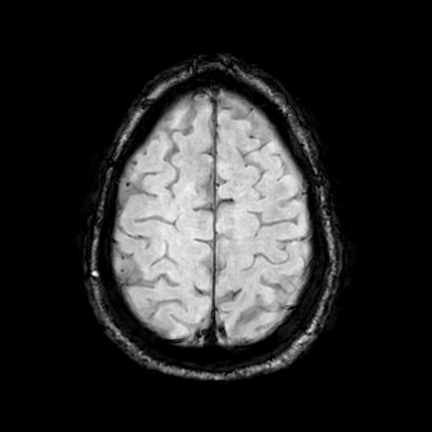
[im 109/109]
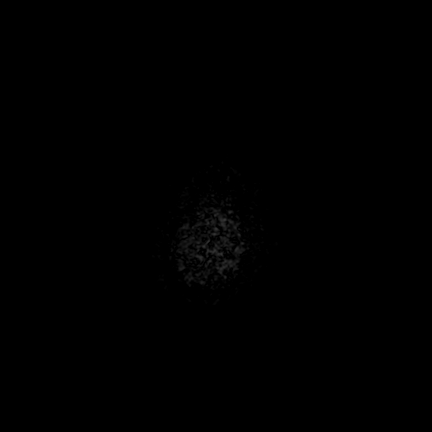

[Series 802: SWI · axial · 10.0mm · 0.53mm/px · z∈[-70,+87]mm · 4 of 80 slices shown (4 of 4)]
[im 1/80]
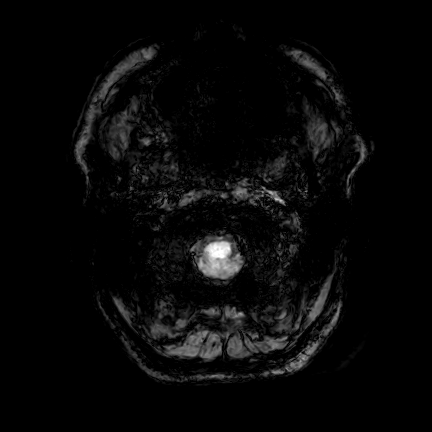
[im 27/80]
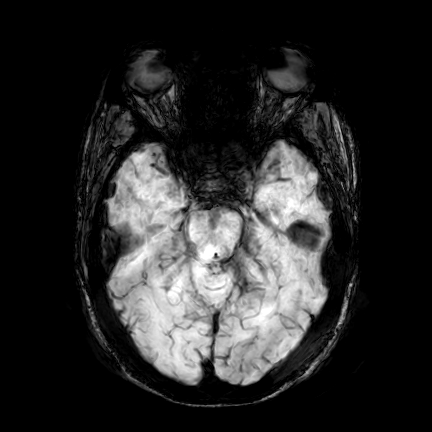
[im 53/80]
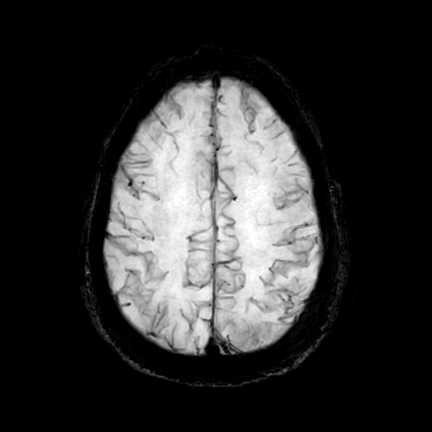
[im 80/80]
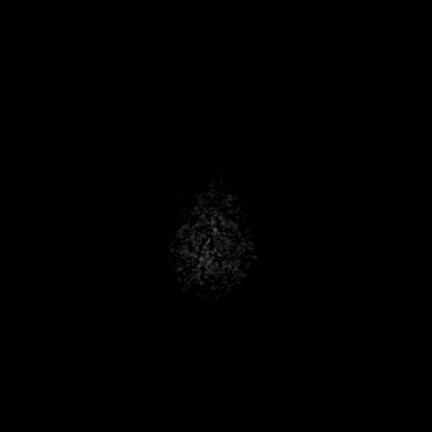

[Series 1001: T2 post-contrast · axial · 5.0mm · 0.41mm/px · 1 of 28 slices shown]
[im 1/28]
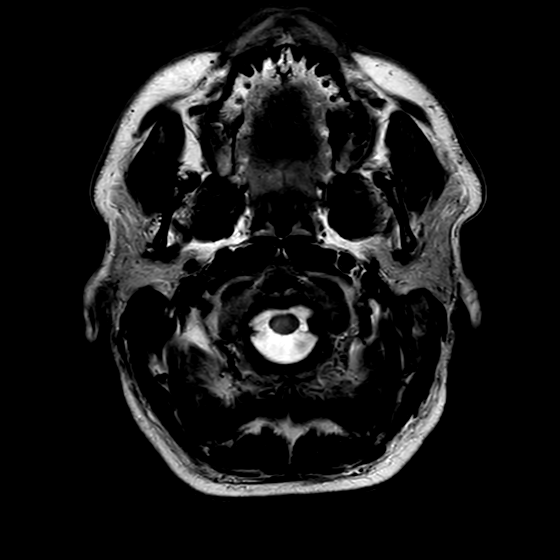

[30 of 48 positions shown; findings below may reference images not displayed]

FINDINGS: -------------------------------------------------------------------------------- 
------------------------- 
INTRACRANIAL: 
-------------------------------------------------------------------------------- 
------------------------- 
MS: 
Supratentorial: There are numerous T2/FLAIR hyperintensities in cerebral white 
matter including the following locations:  Corpus callosum body.. Juxtacortical, 
deep white matter, periventricular. 
Infratentorial: There are 3 T2/FLAIR hyperintensities in the following 
locations: Bilateral cerebellar hemispheres.. 
Mass effect: There is no associated mass effect regarding a particular lesion to 
suggest tumefactive lesion. 
Black Holes: Total of 3 lesions have associated near CSF T1 hypointensity to 
suggest black holes including following locations: Right centrum semiovale, 
right periventricular, left periventricular. 
Post Contrast: No contrast was administered. 
 Overall Disease Burden: mild. 
Brain Atrophy: mild 
No acute ischemia. No abnormal foci of susceptibility artifact in the brain. 
Patency of the intracranial vascular flow voids.  No acute intracranial 
hemorrhage, mass effect, midline shift. No large sellar mass. No hydrocephalus. 
Cerebral volume is age appropriate.  
-------------------------------------------------------------------------------- 
----------------------- 
OTHER: 
ORBITS/SINUSES/T-BONES:  Visualized orbits show no acute abnormality or mass.  
Mastoid air cells and middle ear cavities are grossly clear.  Visualized 
paranasal sinuses are clear. 
MARROW SIGNAL/SOFT TISSUES: No focal suspect signal abnormality.  
-------------------------------------------------------------------------------- 
-------------------
IMPRESSION: Supratentorial and infratentorial foci of abnormal parenchymal signal that would 
be suggestive of, but not diagnostic of, quiescent demyelination. Please note 
that no contrast was administered for this study, however.

## 2023-06-25 IMAGING — MR MRI CERVICAL SPINE WITHOUT CONTRAST
7 of 12 series · 11 of 48 positions shown · IV contrast (gadolinium)
Comparison: None.

________________________________________________________________________________________________ 
MRI CERVICAL SPINE WITHOUT CONTRAST, 06/25/2023 [DATE]: 
CLINICAL INDICATION: Multiple Sclerosis
TECHNIQUE: Multiplanar, multiecho position MR images of the cervical spine were 
performed without intravenous gadolinium enhancement. Patient was scanned on a 
3T magnet.

[Series 101: survey · axial · 10.0mm · 0.94mm/px · z∈[-15,+150]mm · 2 of 9 slices shown]
[im 1/9]
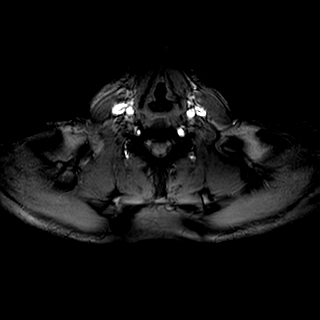
[im 9/9]
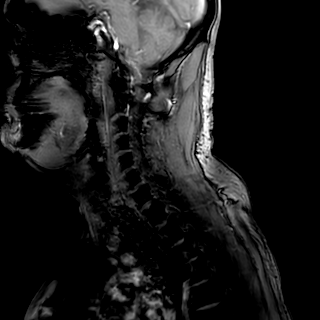

[Series 201: t2w_tse cor · coronal · 5.0mm · 0.52mm/px · 1 of 7 slices shown]
[im 1/7]
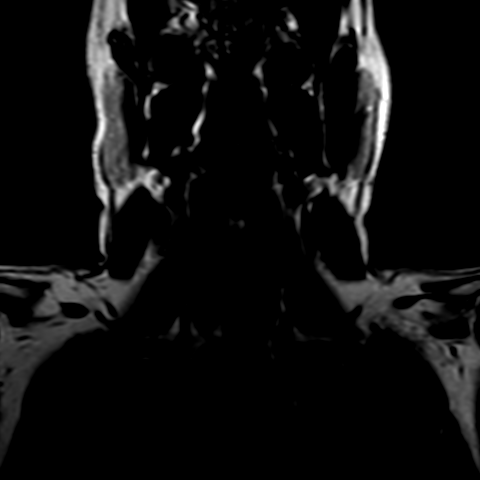

[Series 401: T1 · sagittal · 3.0mm · 0.43mm/px · 1 of 15 slices shown]
[im 1/15]
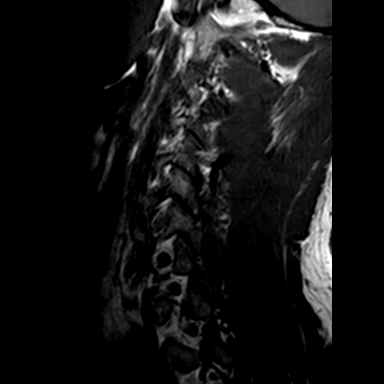

[Series 502: st2w_tse sag fs · sagittal · 3.0mm · 0.37mm/px · 1 of 15 slices shown]
[im 1/15]
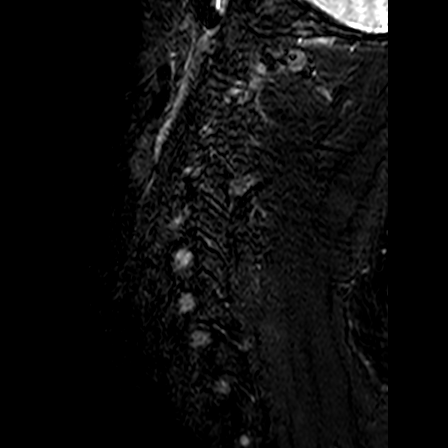

[Series 503: st2w_tse sag · sagittal · 3.0mm · 0.37mm/px · 1 of 15 slices shown]
[im 1/15]
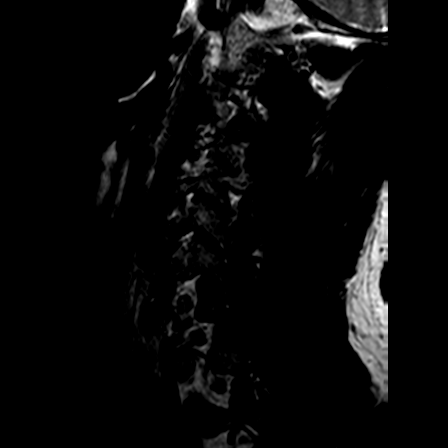

[Series 602: 3dt2 cor/mpr · coronal · 1.0mm · 0.15mm/px · 3 of 65 slices shown]
[im 1/65]
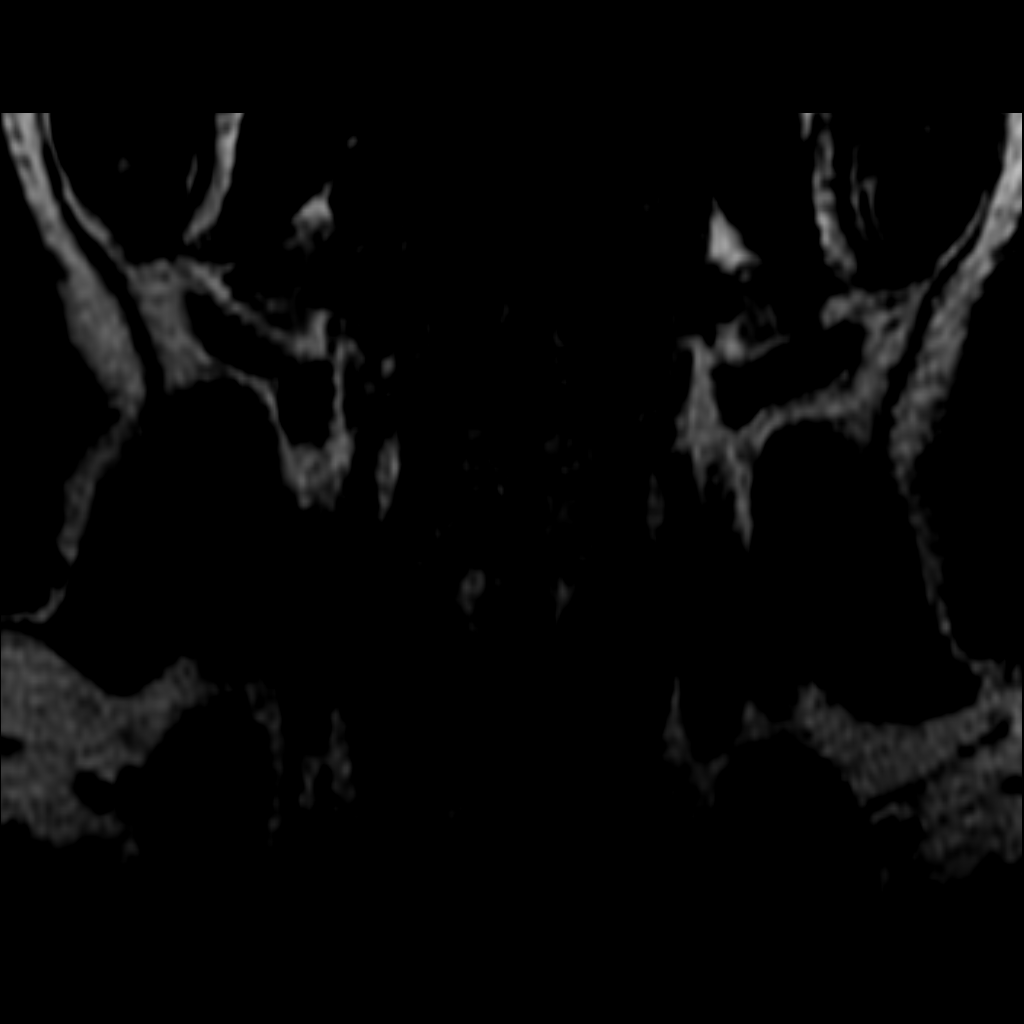
[im 13/65]
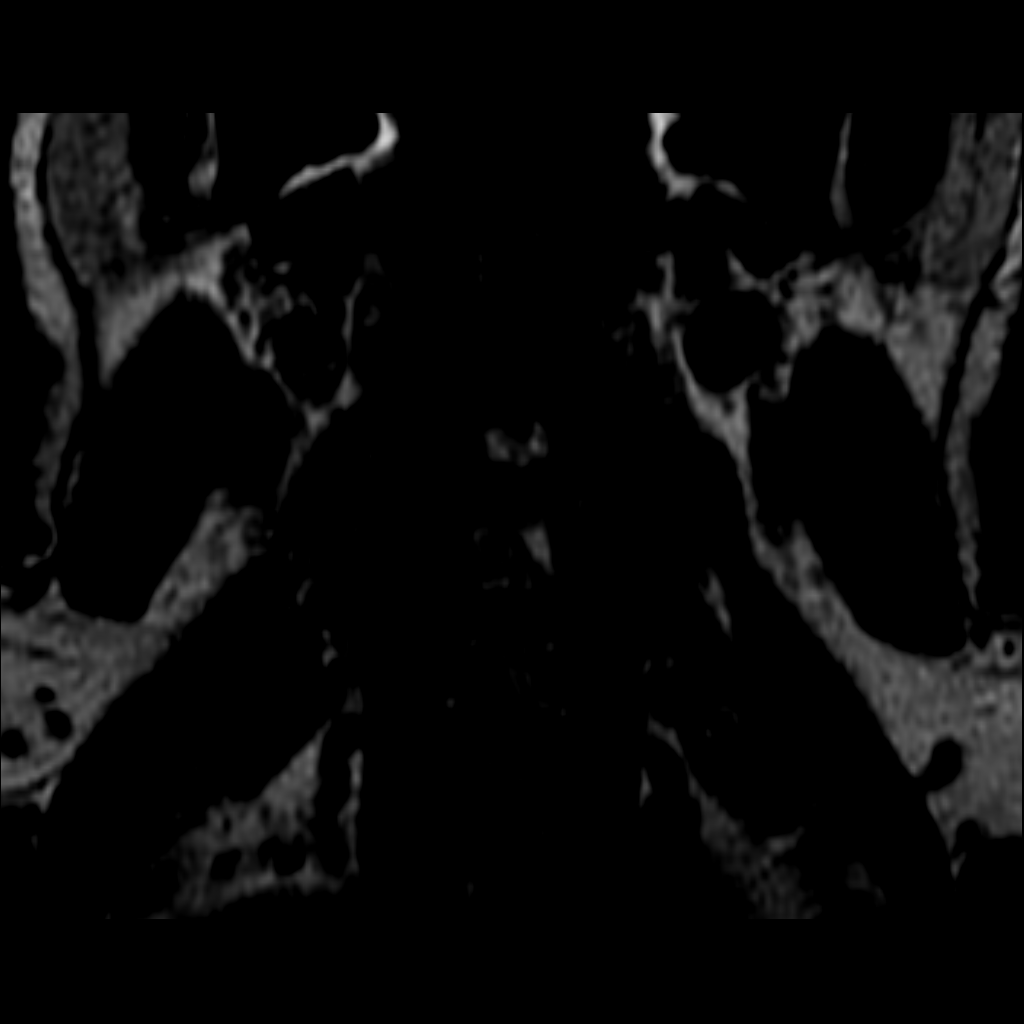
[im 26/65]
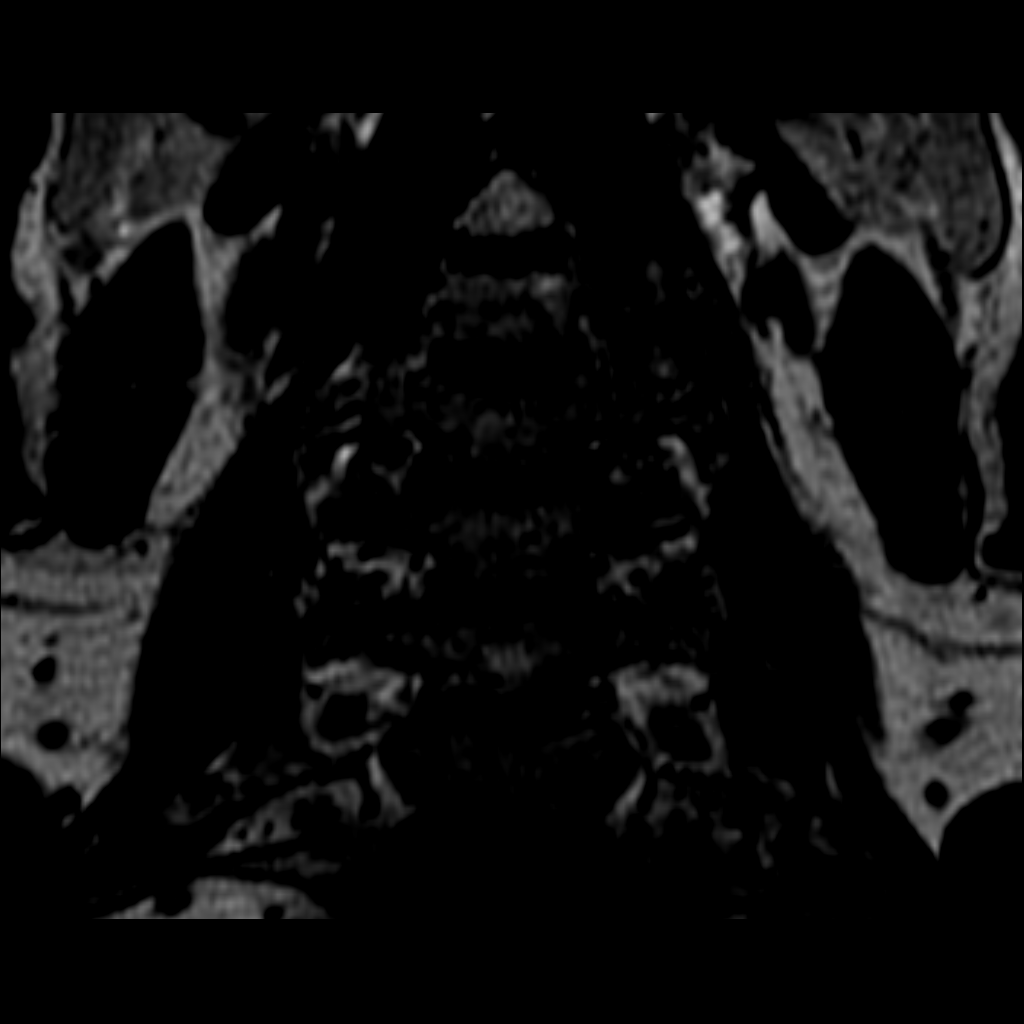

[Series 701: T2 · axial · 3.0mm · 0.34mm/px · z∈[-88,+25]mm · 2 of 19 slices shown]
[im 1/19]
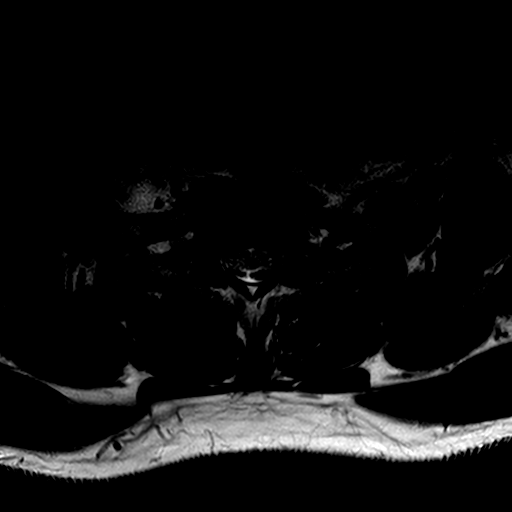
[im 19/19]
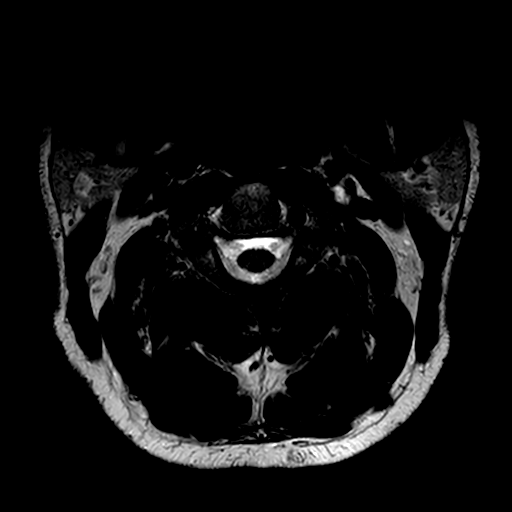

[11 of 48 positions shown; findings below may reference images not displayed]

FINDINGS: -------------------------------------------------------------------------------- 
----------------- 
GENERAL: 
ALIGNMENT: Normal. 
VERTEBRAL BODY HEIGHT: Normal.  
MARROW SIGNAL: No focal suspect signal abnormality. 
CORD SIGNAL: There is T2 hyperintense signal at the cervicomedullary junction at 
the C1-C2 level.  
ADDITIONAL FINDINGS: None. 
-------------------------------------------------------------------------------- 
---------------- 
SEGMENTAL: 
CRANIOCERVICAL JUNCTION: No significant stenosis. 
C2-C3: No significant central canal or neural foraminal narrowing. 
C3-C4: No significant central canal or neural foraminal narrowing. 
C4-C5: Right uncovertebral joint hypertrophy. No significant central canal or 
left neural foraminal narrowing. Mild right neural foraminal narrowing. 
C5-C6: Central shallow disc herniation. Mild central canal narrowing. No 
significant neural foraminal narrowing. 
C6-C7: Trace disc osteophyte complex. No significant central canal or neural 
foraminal narrowing.  
C7-T1: No significant central canal or neural foraminal narrowing. 
-------------------------------------------------------------------------------- 
---------------
IMPRESSION: 1.  T2 hyperintense signal at the cervicomedullary junction likely from known 
history of multiple sclerosis. 
2.  Discogenic/degenerative change in the cervical spine without severe stenosis 
or cord deformity.
